# Patient Record
Sex: Female | Born: 1965
Health system: Southern US, Community
[De-identification: ages and names within clinical notes are randomized; demographics above are authoritative.]

## PROBLEM LIST (undated history)

## (undated) DIAGNOSIS — M1990 Unspecified osteoarthritis, unspecified site: Secondary | ICD-10-CM

## (undated) DIAGNOSIS — I10 Essential (primary) hypertension: Secondary | ICD-10-CM

## (undated) HISTORY — PX: COLONOSCOPY: SHX174

---

## 1996-03-11 HISTORY — PX: TUBAL LIGATION: SHX77

## 1999-03-12 HISTORY — PX: HERNIA REPAIR: SHX51

## 2000-03-28 ENCOUNTER — Ambulatory Visit (HOSPITAL_BASED_OUTPATIENT_CLINIC_OR_DEPARTMENT_OTHER): Admission: RE | Admit: 2000-03-28 | Discharge: 2000-03-28 | Payer: Self-pay | Admitting: *Deleted

## 2013-04-19 ENCOUNTER — Ambulatory Visit: Payer: Self-pay | Admitting: Physician Assistant

## 2014-12-14 ENCOUNTER — Emergency Department
Admission: EM | Admit: 2014-12-14 | Discharge: 2014-12-14 | Disposition: A | Discharge status: Home / Self Care | Payer: Self-pay | Attending: Emergency Medicine | Admitting: Emergency Medicine

## 2014-12-14 ENCOUNTER — Emergency Department: Payer: Self-pay

## 2014-12-14 ENCOUNTER — Encounter: Payer: Self-pay | Admitting: Emergency Medicine

## 2014-12-14 DIAGNOSIS — T485X5A Adverse effect of other anti-common-cold drugs, initial encounter: Secondary | ICD-10-CM

## 2014-12-14 DIAGNOSIS — I10 Essential (primary) hypertension: Secondary | ICD-10-CM

## 2014-12-14 DIAGNOSIS — R079 Chest pain, unspecified: Secondary | ICD-10-CM | POA: Insufficient documentation

## 2014-12-14 DIAGNOSIS — R0981 Nasal congestion: Secondary | ICD-10-CM | POA: Insufficient documentation

## 2014-12-14 DIAGNOSIS — J310 Chronic rhinitis: Secondary | ICD-10-CM

## 2014-12-14 DIAGNOSIS — Z87891 Personal history of nicotine dependence: Secondary | ICD-10-CM | POA: Insufficient documentation

## 2014-12-14 HISTORY — DX: Essential (primary) hypertension: I10

## 2014-12-14 LAB — TROPONIN I: Troponin I: <0.03 ng/mL (ref <0.031)

## 2014-12-14 LAB — BASIC METABOLIC PANEL
ANION GAP: 5 (ref 5–15)
BUN: 16 mg/dL (ref 6–20)
CALCIUM: 8.8 mg/dL — AB (ref 8.9–10.3)
CO2: 27 mmol/L (ref 22–32)
Chloride: 100 mmol/L — ABNORMAL LOW (ref 101–111)
Creatinine, Ser: 0.85 mg/dL (ref 0.44–1.00)
GFR calc Af Amer: >60 mL/min (ref >60)
GFR calc non Af Amer: >60 mL/min (ref >60)
GLUCOSE: 104 mg/dL — AB (ref 65–99)
Potassium: 3.4 mmol/L — ABNORMAL LOW (ref 3.5–5.1)
Sodium: 132 mmol/L — ABNORMAL LOW (ref 135–145)

## 2014-12-14 LAB — CBC WITH DIFFERENTIAL/PLATELET
Basophils Absolute: 0.1 10*3/uL (ref 0–0.1)
Basophils Relative: 1 %
EOS ABS: 0.3 10*3/uL (ref 0–0.7)
Eosinophils Relative: 4 %
HEMATOCRIT: 36 % (ref 35.0–47.0)
HEMOGLOBIN: 12.2 g/dL (ref 12.0–16.0)
LYMPHS ABS: 3.6 10*3/uL (ref 1.0–3.6)
LYMPHS PCT: 50 %
MCH: 32.4 pg (ref 26.0–34.0)
MCHC: 33.8 g/dL (ref 32.0–36.0)
MCV: 95.8 fL (ref 80.0–100.0)
MONOS PCT: 8 %
Monocytes Absolute: 0.6 10*3/uL (ref 0.2–0.9)
NEUTROS PCT: 37 %
Neutro Abs: 2.7 10*3/uL (ref 1.4–6.5)
Platelets: 264 10*3/uL (ref 150–440)
RBC: 3.75 MIL/uL — ABNORMAL LOW (ref 3.80–5.20)
RDW: 12.3 % (ref 11.5–14.5)
WBC: 7.2 10*3/uL (ref 3.6–11.0)

## 2014-12-14 MED ORDER — AMLODIPINE BESYLATE 5 MG PO TABS
5.0000 mg | ORAL_TABLET | Freq: Once | ORAL | Status: AC
  Administered 2014-12-14: 5 mg via ORAL

## 2014-12-14 MED ORDER — AMLODIPINE BESYLATE 5 MG PO TABS: 5.0000 mg | ORAL_TABLET | Freq: Two times a day (BID) | ORAL | Status: DC

## 2014-12-14 MED ORDER — AMLODIPINE BESYLATE 5 MG PO TABS: 5.0000 mg | ORAL_TABLET | Freq: Two times a day (BID) | ORAL | Status: AC

## 2014-12-14 MED ORDER — AMLODIPINE BESYLATE 5 MG PO TABS
ORAL_TABLET | ORAL | Status: AC
  Filled 2014-12-14: qty 1

## 2014-12-14 NOTE — Discharge Instructions (Signed)
You were evaluated for nasal congestion and I suspect that he may have seasonal allergies, but that the nasal congestion is worse due to the prolonged used of the decongestant spray. Stop the nasal spray been using.  You may try an over-the-counter nasal spray that has "nasal saline. " You should also try over-the-counter allergy medication Zyrtec, use as directed.  You're given a refill on your previous blood pressure medication Norvasc at year old dose of 5 mg twice daily. Follow-up with a primary care physician, you're referred to the [facility] clinic within one week for blood pressure recheck and follow-up.  Return to the emergency department for any new or worsening condition including any chest pain, shortness of breath, trouble breathing, lightheadedness, passing out, or any other symptoms concerning to you.    Hypertension Hypertension, commonly called high blood pressure, is when the force of blood pumping through your arteries is too strong. Your arteries are the blood vessels that carry blood from your heart throughout your body. A blood pressure reading consists of a higher number over a lower number, such as 110/72. The higher number (systolic) is the pressure inside your arteries when your heart pumps. The lower number (diastolic) is the pressure inside your arteries when your heart relaxes. Ideally you want your blood pressure below 120/80. Hypertension forces your heart to work harder to pump blood. Your arteries may become narrow or stiff. Having untreated or uncontrolled hypertension can cause heart attack, stroke, kidney disease, and other problems. RISK FACTORS Some risk factors for high blood pressure are controllable. Others are not.  Risk factors you cannot control include:   Race. You may be at higher risk if you are African American.  Age. Risk increases with age.  Gender. Men are at higher risk than women before age 40 years. After age 32, women are at higher risk than  men. Risk factors you can control include:  Not getting enough exercise or physical activity.  Being overweight.  Getting too much fat, sugar, calories, or salt in your diet.  Drinking too much alcohol. SIGNS AND SYMPTOMS Hypertension does not usually cause signs or symptoms. Extremely high blood pressure (hypertensive crisis) may cause headache, anxiety, shortness of breath, and nosebleed. DIAGNOSIS To check if you have hypertension, your health care provider will measure your blood pressure while you are seated, with your arm held at the level of your heart. It should be measured at least twice using the same arm. Certain conditions can cause a difference in blood pressure between your right and left arms. A blood pressure reading that is higher than normal on one occasion does not mean that you need treatment. If it is not clear whether you have high blood pressure, you may be asked to return on a different day to have your blood pressure checked again. Or, you may be asked to monitor your blood pressure at home for 1 or more weeks. TREATMENT Treating high blood pressure includes making lifestyle changes and possibly taking medicine. Living a healthy lifestyle can help lower high blood pressure. You may need to change some of your habits. Lifestyle changes may include:  Following the DASH diet. This diet is high in fruits, vegetables, and whole grains. It is low in salt, red meat, and added sugars.  Keep your sodium intake below 2,300 mg per day.  Getting at least 30-45 minutes of aerobic exercise at least 4 times per week.  Losing weight if necessary.  Not smoking.  Limiting alcoholic beverages.  Learning ways to reduce stress. Your health care provider may prescribe medicine if lifestyle changes are not enough to get your blood pressure under control, and if one of the following is true:  You are 40-9 years of age and your systolic blood pressure is above 140.  You are 57  years of age or older, and your systolic blood pressure is above 150.  Your diastolic blood pressure is above 90.  You have diabetes, and your systolic blood pressure is over 140 or your diastolic blood pressure is over 90.  You have kidney disease and your blood pressure is above 140/90.  You have heart disease and your blood pressure is above 140/90. Your personal target blood pressure may vary depending on your medical conditions, your age, and other factors. HOME CARE INSTRUCTIONS  Have your blood pressure rechecked as directed by your health care provider.   Take medicines only as directed by your health care provider. Follow the directions carefully. Blood pressure medicines must be taken as prescribed. The medicine does not work as well when you skip doses. Skipping doses also puts you at risk for problems.  Do not smoke.   Monitor your blood pressure at home as directed by your health care provider. SEEK MEDICAL CARE IF:   You think you are having a reaction to medicines taken.  You have recurrent headaches or feel dizzy.  You have swelling in your ankles.  You have trouble with your vision. SEEK IMMEDIATE MEDICAL CARE IF:  You develop a severe headache or confusion.  You have unusual weakness, numbness, or feel faint.  You have severe chest or abdominal pain.  You vomit repeatedly.  You have trouble breathing. MAKE SURE YOU:   Understand these instructions.  Will watch your condition.  Will get help right away if you are not doing well or get worse.   This information is not intended to replace advice given to you by your health care provider. Make sure you discuss any questions you have with your health care provider.   Document Released: 02/25/2005 Document Revised: 07/12/2014 Document Reviewed: 12/18/2012 Elsevier Interactive Patient Education [employer]

## 2014-12-14 NOTE — ED Provider Notes (Signed)
[facility] Emergency Department Provider Note   ____________________________________________  Time seen: 7:25 PM I have reviewed the triage vital signs and the triage nursing note.  HISTORY  Chief Complaint Nasal Congestion and Chest Pain   Historian Patient  HPI Heidi Freeman is a 49 y.o. female who presents for nasal congestion. Patient states it's been like this for about 1 month. She's been using oxymetazoline nasal spray 1 to several times per day. She also states that she's been having drainage postnasally and gags on sputum. No yellow sputum. No fever. No coughing or trouble breathing. She does occasionally have chest tightness or pressure at the tip of the sternum. She does not describe this as chest pain. She is denying GERD symptoms. She has taken herself off of her blood pressure medications recently after she did not refill her prescription. She is currently without a primary care physician. She is previously taking 5 mg Norvasc twice a day for several years reportedly.    Past Medical History  Diagnosis Date  . Hypertension     There are no active problems to display for this patient.   History reviewed. No pertinent past surgical history.  Current Outpatient Rx  Name  Route  Sig  Dispense  Refill  . amLODipine (NORVASC) 5 MG tablet   Oral   Take 1 tablet (5 mg total) by mouth 2 (two) times daily.   60 tablet   0     Allergies Review of patient's allergies indicates no known allergies.  History reviewed. No pertinent family history.  Social History Social History  Substance Use Topics  . Smoking status: Former Games developer  . Smokeless tobacco: None  . Alcohol Use: No    Review of Systems  Constitutional: Negative for fever. Eyes: Negative for visual changes. ENT: Negative for sore throat.positive for nasal congestion. Cardiovascular: Negative for chest pain.occasional sternal burning. Respiratory: Negative for shortness of  breath. Gastrointestinal: Negative for abdominal pain, vomiting and diarrhea. Genitourinary: Negative for dysuria. Musculoskeletal: Negative for back pain. Skin: Negative for rash. Neurological: Negative for headache. 10 point Review of Systems otherwise negative ____________________________________________   PHYSICAL EXAM:  VITAL SIGNS: ED Triage Vitals  Enc Vitals Group     BP 12/14/14 1852 224/109 mmHg     Pulse Rate 12/14/14 1852 89     Resp 12/14/14 1852 20     Temp 12/14/14 1852 98.2 F (36.8 C)     Temp Source 12/14/14 1852 Oral     SpO2 12/14/14 1852 100 %     Weight 12/14/14 1852 120 lb (54.432 kg)     Height 12/14/14 1852 5' (1.524 m)     Head Cir --      Peak Flow --      Pain Score 12/14/14 1903 0     Pain Loc --      Pain Edu? --      Excl. in GC? --      Constitutional: Alert and oriented. Well appearing and in no distress. Eyes: Conjunctivae are normal. PERRL. Normal extraocular movements. ENT   Head: Normocephalic and atraumatic.   Nose: No congestion/rhinnorhea.   Mouth/Throat: Mucous membranes are moist.   Neck: No stridor. Cardiovascular/Chest: Normal rate, regular rhythm.  No murmurs, rubs, or gallops. Respiratory: Normal respiratory effort without tachypnea nor retractions. Breath sounds are clear and equal bilaterally. No wheezes/rales/rhonchi. Gastrointestinal: Soft. No distention, no guarding, no rebound. Nontender   Genitourinary/rectal:Deferred Musculoskeletal: Nontender with normal range of motion in all  extremities. No joint effusions.  No lower extremity tenderness.  No edema. Neurologic:  Normal speech and language. No gross or focal neurologic deficits are appreciated. Skin:  Skin is warm, dry and intact. No rash noted. Psychiatric: Mood and affect are normal. Speech and behavior are normal. Patient exhibits appropriate insight and judgment.  ____________________________________________   EKG I, Governor Rooks, MD, the  attending physician have personally viewed and interpreted all ECGs.  90 bpm. Normal sinus rhythm. Narrow QRS. Normal axis. Nonspecific T wave. ____________________________________________  LABS (pertinent positives/negatives)  Sodium 132, potassium 3.4, chloride100, other wise basic metabolic panel without significant abnormality Troponin less than 0.03 White blood count 7.2, hemoglobin 12.2 and platelet count 264  ____________________________________________  RADIOLOGY All Xrays were viewed by me. Imaging interpreted by Radiologist.  Chest x-ray two-view: Negative chest x-ray __________________________________________  PROCEDURES  Procedure(s) performed: None  Critical Care performed: None  ____________________________________________   ED COURSE / ASSESSMENT AND PLAN  CONSULTATIONS: None  Pertinent labs & imaging results that were available during my care of the patient were reviewed by me and considered in my medical decision making (see chart for details).   Patient's nasal congestion I suspect may have started off as either a virus or seasonal allergies, however is now rebound nasal congestion due to prolonged decongestant use. I discussed this with the patient. She is to try nasal saline spray, and Zyrtec. With respect to her elevated blood pressure, I don't think she is having any symptoms related to it, and her EKG and labs are reassuring. She'll be restarted on her prior blood pressure medication which is Norvasc 5 mg twice daily.  Repeat blood pressure 160/103. Patient able to be discharged as she is symptomatic.  Patient / Family / Caregiver informed of clinical course, medical decision-making process, and agree with plan.   I discussed return precautions, follow-up instructions, and discharged instructions with patient and/or family.  ___________________________________________   FINAL CLINICAL IMPRESSION(S) / ED DIAGNOSES   Final diagnoses:  Nasal  congestion due to prolonged use of decongestants  Essential hypertension       Governor Rooks, MD 12/14/14 2148

## 2014-12-14 NOTE — ED Notes (Addendum)
Pt states she ahs a hx of HTN, and has not been compliant with her medications. Pt states has been taking OTC nasal spray for nasal congestion that drains down into her chest and causes tightness in the epigastric region. Pt states occassional SOB, but denies SOB today. Pt states these symptoms have been going on x 1 mo.

## 2016-10-11 ENCOUNTER — Other Ambulatory Visit: Payer: Self-pay | Admitting: Physician Assistant

## 2016-10-11 DIAGNOSIS — Z1239 Encounter for other screening for malignant neoplasm of breast: Secondary | ICD-10-CM

## 2016-11-28 ENCOUNTER — Ambulatory Visit
Admission: RE | Admit: 2016-11-28 | Discharge: 2016-11-28 | Disposition: A | Discharge status: Home / Self Care | Payer: BLUE CROSS/BLUE SHIELD | Source: Ambulatory Visit | Attending: Physician Assistant | Admitting: Physician Assistant

## 2016-11-28 DIAGNOSIS — Z1231 Encounter for screening mammogram for malignant neoplasm of breast: Secondary | ICD-10-CM | POA: Diagnosis not present

## 2016-11-28 DIAGNOSIS — Z1239 Encounter for other screening for malignant neoplasm of breast: Secondary | ICD-10-CM

## 2018-01-13 ENCOUNTER — Other Ambulatory Visit: Payer: Self-pay | Admitting: Physician Assistant

## 2018-01-19 ENCOUNTER — Other Ambulatory Visit: Payer: Self-pay | Admitting: Physician Assistant

## 2018-01-19 DIAGNOSIS — Z1231 Encounter for screening mammogram for malignant neoplasm of breast: Secondary | ICD-10-CM

## 2018-02-23 ENCOUNTER — Ambulatory Visit
Admission: RE | Admit: 2018-02-23 | Discharge: 2018-02-23 | Disposition: A | Discharge status: Home / Self Care | Payer: BLUE CROSS/BLUE SHIELD | Source: Ambulatory Visit | Attending: Physician Assistant | Admitting: Physician Assistant

## 2018-02-23 DIAGNOSIS — Z1231 Encounter for screening mammogram for malignant neoplasm of breast: Secondary | ICD-10-CM

## 2018-12-15 ENCOUNTER — Other Ambulatory Visit: Payer: Self-pay

## 2018-12-15 DIAGNOSIS — Z20822 Contact with and (suspected) exposure to covid-19: Secondary | ICD-10-CM

## 2018-12-17 ENCOUNTER — Telehealth: Payer: Self-pay | Admitting: General Practice

## 2018-12-17 LAB — NOVEL CORONAVIRUS, NAA: SARS-CoV-2, NAA: NOT DETECTED

## 2018-12-17 NOTE — Telephone Encounter (Signed)
Negative COVID results given. Patient results "NOT Detected." Caller expressed understanding. ° °

## 2019-05-26 ENCOUNTER — Other Ambulatory Visit: Payer: Self-pay | Admitting: Physician Assistant

## 2019-05-26 DIAGNOSIS — Z1231 Encounter for screening mammogram for malignant neoplasm of breast: Secondary | ICD-10-CM

## 2019-06-01 ENCOUNTER — Ambulatory Visit: Payer: Self-pay

## 2019-06-02 ENCOUNTER — Other Ambulatory Visit: Payer: Self-pay

## 2019-06-02 ENCOUNTER — Ambulatory Visit
Admission: RE | Admit: 2019-06-02 | Discharge: 2019-06-02 | Disposition: A | Discharge status: Home / Self Care | Payer: 59 | Source: Ambulatory Visit | Attending: Physician Assistant | Admitting: Physician Assistant

## 2019-06-02 ENCOUNTER — Ambulatory Visit
Admission: EM | Admit: 2019-06-02 | Discharge: 2019-06-02 | Discharge status: Against Medical Advice | Payer: 59 | Source: Ambulatory Visit | Attending: Physician Assistant | Admitting: Physician Assistant

## 2019-06-02 DIAGNOSIS — Z1231 Encounter for screening mammogram for malignant neoplasm of breast: Secondary | ICD-10-CM

## 2020-08-21 ENCOUNTER — Other Ambulatory Visit: Payer: Self-pay | Admitting: Physician Assistant

## 2020-08-30 ENCOUNTER — Other Ambulatory Visit: Payer: Self-pay | Admitting: Physician Assistant

## 2020-08-30 DIAGNOSIS — Z1231 Encounter for screening mammogram for malignant neoplasm of breast: Secondary | ICD-10-CM

## 2020-09-07 ENCOUNTER — Other Ambulatory Visit: Payer: Self-pay | Admitting: Physician Assistant

## 2020-09-12 ENCOUNTER — Ambulatory Visit
Admission: RE | Admit: 2020-09-12 | Discharge: 2020-09-12 | Disposition: A | Discharge status: Home / Self Care | Payer: 59 | Source: Ambulatory Visit | Attending: Physician Assistant | Admitting: Physician Assistant

## 2020-09-12 ENCOUNTER — Other Ambulatory Visit: Payer: Self-pay

## 2020-09-12 DIAGNOSIS — Z1231 Encounter for screening mammogram for malignant neoplasm of breast: Secondary | ICD-10-CM | POA: Diagnosis not present

## 2020-10-03 ENCOUNTER — Encounter: Payer: Self-pay | Admitting: Unknown Physician Specialty

## 2020-11-15 ENCOUNTER — Encounter: Payer: Self-pay | Admitting: Unknown Physician Specialty

## 2020-11-21 NOTE — Discharge Instructions (Signed)
[facility] ENDOSCOPIC SINUS SURGERY [facility]  What is Functional Endoscopic Sinus Surgery?  The Surgery involves making the natural openings of the sinuses larger by removing the bony partitions that separate the sinuses from the nasal cavity.  The natural sinus lining is preserved as much as possible to allow the sinuses to resume normal function after the surgery.  In some patients nasal polyps (excessively swollen lining of the sinuses) may be removed to relieve obstruction of the sinus openings.  The surgery is performed through the nose using lighted scopes, which eliminates the need for incisions on the face.  A septoplasty is a different procedure which is sometimes performed with sinus surgery.  It involves straightening the boy partition that separates the two sides of your nose.  A crooked or deviated septum may need repair if is obstructing the sinuses or nasal airflow.  Turbinate reduction is also often performed during sinus surgery.  The turbinates are bony proturberances from the side walls of the nose which swell and can obstruct the nose in patients with sinus and allergy problems.  Their size can be surgically reduced to help relieve nasal obstruction.  What Can Sinus Surgery Do For Me?  Sinus surgery can reduce the frequency of sinus infections requiring antibiotic treatment.  This can provide improvement in nasal congestion, post-nasal drainage, facial pressure and nasal obstruction.  Surgery will NOT prevent you from ever having an infection again, so it usually only for patients who get infections 4 or more times yearly requiring antibiotics, or for infections that do not clear with antibiotics.  It will not cure nasal allergies, so patients with allergies may still require medication to treat their allergies after surgery. Surgery may improve headaches related to sinusitis, however, some people will continue to  require medication to control sinus headaches related to allergies.  Surgery will do nothing for other forms of headache (migraine, tension or cluster).  What Are the Risks of Endoscopic Sinus Surgery?  Current techniques allow surgery to be performed safely with little risk, however, there are rare complications that patients should be aware of.  Because the sinuses are located around the eyes, there is risk of eye injury, including blindness, though again, this would be quite rare. This is usually a result of bleeding behind the eye during surgery, which can effect vision, though there are treatments to protect the vision and prevent permanent injury. More serious complications would include bleeding inside the brain cavity or damage to the brain.This happens when the fluid around the brain leaks out into the sinus cavity.  Again, all of these complications are uncommon, and spinal fluid leaks can be safely managed surgically if they occur.  The most common complication of sinus surgery is bleeding from the nose, which may require packing or cauterization of the nose.  Patients with polyps may experience recurrence of the polyps that would require revision surgery.  Alterations of sense of smell or injury to the tear ducts are also rare complications.   What is the Surgery Like, and what is the Recovery?  The Surgery usually takes a couple of hours to perform, and is usually performed under a general anesthetic (completely asleep).  Patients are usually discharged home after a couple of hours.  Sometimes during surgery it is necessary to pack the nose to control bleeding, and the packing is left in place for 24 - 48 hours, and removed by your surgeon.  If   a septoplasty was performed during the procedure, there is often a splint placed which must be removed after 5-7 days.   Discomfort: Pain is usually mild to moderate, and can be controlled by prescription pain medication or acetaminophen (Tylenol).   Aspirin, Ibuprofen (Advil, Motrin), or Naprosyn (Aleve) should be avoided, as they can cause increased bleeding.  Most patients feel sinus pressure like they have a bad head cold for several days.  Sleeping with your head elevated can help reduce swelling and facial pressure, as can ice packs over the face.  A humidifier may be helpful to keep the mucous and blood from drying in the nose.   Diet: There are no specific diet restrictions, however, you should generally start with clear liquids and a light diet of bland foods because the anesthetic can cause some nausea.  Advance your diet depending on how your stomach feels.  Taking your pain medication with food will often help reduce stomach upset which pain medications can cause.  Nasal Saline Irrigation: It is important to remove blood clots and dried mucous from the nose as it is healing.  This is done by having you irrigate the nose at least 3 - 4 times daily with a salt water solution.  We recommend using NeilMed Sinus Rinse (available at the drug store).  Fill the squeeze bottle with the solution, bend over a sink, and insert the tip of the squeeze bottle into the nose  of an inch.  Point the tip of the squeeze bottle towards the inside corner of the eye on the same side your irrigating.  Squeeze the bottle and gently irrigate the nose.  If you bend forward as you do this, most of the fluid will flow back out of the nose, instead of down your throat.   The solution should be warm, near body temperature, when you irrigate.   Each time you irrigate, you should use a full squeeze bottle.   Note that if you are instructed to use Nasal Steroid Sprays at any time after your surgery, irrigate with saline BEFORE using the steroid spray, so you do not wash it all out of the nose. Another product, Nasal Saline Gel (such as AYR Nasal Saline Gel) can be applied in each nostril 3 - 4 times daily to moisture the nose and reduce scabbing or crusting.  Bleeding:   Bloody drainage from the nose can be expected for several days, and patients are instructed to irrigate their nose frequently with salt water to help remove mucous and blood clots.  The drainage may be dark red or brown, though some fresh blood may be seen intermittently, especially after irrigation.  Do not blow you nose, as bleeding may occur. If you must sneeze, keep your mouth open to allow air to escape through your mouth.  If heavy bleeding occurs: Irrigate the nose with saline to rinse out clots, then spray the nose 3 - 4 times with Afrin Nasal Decongestant Spray.  The spray will constrict the blood vessels to slow bleeding.  Pinch the lower half of your nose shut to apply pressure, and lay down with your head elevated.  Ice packs over the nose may help as well. If bleeding persists despite these measures, you should notify your doctor.  Do not use the Afrin routinely to control nasal congestion after surgery, as it can result in worsening congestion and may affect healing.     Activity: Return to work varies among patients. Most patients will be out   of work at least 5 - 7 days to recover.  Patient may return to work after they are off of narcotic pain medication, and feeling well enough to perform the functions of their job.  Patients must avoid heavy lifting (over 10 pounds) or strenuous physical for 2 weeks after surgery, so your employer may need to assign you to light duty, or keep you out of work longer if light duty is not possible.  NOTE: you should not drive, operate dangerous machinery, do any mentally demanding tasks or make any important legal or financial decisions while on narcotic pain medication and recovering from the general anesthetic.    Call Your Doctor Immediately if You Have Any of the Following: Bleeding that you cannot control with the above measures Loss of vision, double vision, bulging of the eye or black eyes. Fever over 101 degrees Neck stiffness with severe headache,  fever, nausea and change in mental state. You are always encouraged to call anytime with concerns, however, please call with requests for pain medication refills during office hours.  Office Endoscopy: During follow-up visits your doctor will remove any packing or splints that may have been placed and evaluate and clean your sinuses endoscopically.  Topical anesthetic will be used to make this as comfortable as possible, though you may want to take your pain medication prior to the visit.  How often this will need to be done varies from patient to patient.  After complete recovery from the surgery, you may need follow-up endoscopy from time to time, particularly if there is concern of recurrent infection or nasal polyps.  

## 2020-11-24 ENCOUNTER — Ambulatory Visit: Payer: 59 | Admitting: Anesthesiology

## 2020-11-24 ENCOUNTER — Other Ambulatory Visit: Payer: Self-pay

## 2020-11-24 ENCOUNTER — Ambulatory Visit
Admission: RE | Admit: 2020-11-24 | Discharge: 2020-11-24 | Disposition: A | Discharge status: Home / Self Care | Payer: 59 | Attending: Unknown Physician Specialty | Admitting: Unknown Physician Specialty

## 2020-11-24 ENCOUNTER — Encounter: Payer: Self-pay | Admitting: Unknown Physician Specialty

## 2020-11-24 ENCOUNTER — Encounter
Admission: RE | Disposition: A | Discharge status: Home / Self Care | Payer: Self-pay | Source: Home / Self Care | Attending: Unknown Physician Specialty

## 2020-11-24 DIAGNOSIS — J342 Deviated nasal septum: Secondary | ICD-10-CM | POA: Diagnosis not present

## 2020-11-24 DIAGNOSIS — J3489 Other specified disorders of nose and nasal sinuses: Secondary | ICD-10-CM | POA: Insufficient documentation

## 2020-11-24 DIAGNOSIS — Z87891 Personal history of nicotine dependence: Secondary | ICD-10-CM | POA: Diagnosis not present

## 2020-11-24 DIAGNOSIS — Z79899 Other long term (current) drug therapy: Secondary | ICD-10-CM | POA: Insufficient documentation

## 2020-11-24 HISTORY — DX: Unspecified osteoarthritis, unspecified site: M19.90

## 2020-11-24 HISTORY — PX: NASAL TURBINATE REDUCTION: SHX2072

## 2020-11-24 HISTORY — PX: SEPTOPLASTY: SHX2393

## 2020-11-24 SURGERY — SEPTOPLASTY, NOSE
Anesthesia: General | Site: Nose

## 2020-11-24 MED ORDER — GLYCOPYRROLATE 0.2 MG/ML IJ SOLN
INTRAMUSCULAR | Status: DC | PRN
  Administered 2020-11-24: .1 mg via INTRAVENOUS

## 2020-11-24 MED ORDER — LIDOCAINE HCL (CARDIAC) PF 100 MG/5ML IV SOSY
PREFILLED_SYRINGE | INTRAVENOUS | Status: DC | PRN
  Administered 2020-11-24: 50 mg via INTRAVENOUS

## 2020-11-24 MED ORDER — MIDAZOLAM HCL 5 MG/5ML IJ SOLN
INTRAMUSCULAR | Status: DC | PRN
  Administered 2020-11-24: 2 mg via INTRAVENOUS

## 2020-11-24 MED ORDER — SCOPOLAMINE 1 MG/3DAYS TD PT72
1.0000 | MEDICATED_PATCH | Freq: Once | TRANSDERMAL | Status: DC
  Administered 2020-11-24: 1.5 mg via TRANSDERMAL

## 2020-11-24 MED ORDER — LACTATED RINGERS IV SOLN: INTRAVENOUS | Status: DC

## 2020-11-24 MED ORDER — ONDANSETRON HCL 4 MG/2ML IJ SOLN
4.0000 mg | Freq: Once | INTRAMUSCULAR | Status: AC | PRN
  Administered 2020-11-24: 4 mg via INTRAVENOUS

## 2020-11-24 MED ORDER — PHENYLEPHRINE HCL 0.5 % NA SOLN
NASAL | Status: DC | PRN
  Administered 2020-11-24: 15 mL via TOPICAL

## 2020-11-24 MED ORDER — FENTANYL CITRATE PF 50 MCG/ML IJ SOSY
25.0000 ug | PREFILLED_SYRINGE | INTRAMUSCULAR | Status: DC | PRN
  Administered 2020-11-24: 25 ug via INTRAVENOUS

## 2020-11-24 MED ORDER — FENTANYL CITRATE (PF) 100 MCG/2ML IJ SOLN
INTRAMUSCULAR | Status: DC | PRN
  Administered 2020-11-24 (×2): 50 ug via INTRAVENOUS

## 2020-11-24 MED ORDER — OXYCODONE HCL 5 MG/5ML PO SOLN
5.0000 mg | Freq: Once | ORAL | Status: AC | PRN
  Administered 2020-11-24: 5 mg via ORAL

## 2020-11-24 MED ORDER — OXYCODONE HCL 5 MG PO TABS
5.0000 mg | ORAL_TABLET | Freq: Once | ORAL | Status: AC | PRN
Start: 2020-11-24 — End: 2020-11-24

## 2020-11-24 MED ORDER — PROPOFOL 10 MG/ML IV BOLUS
INTRAVENOUS | Status: DC | PRN
  Administered 2020-11-24: 30 mg via INTRAVENOUS
  Administered 2020-11-24: 140 mg via INTRAVENOUS

## 2020-11-24 MED ORDER — DEXAMETHASONE SODIUM PHOSPHATE 4 MG/ML IJ SOLN
INTRAMUSCULAR | Status: DC | PRN
  Administered 2020-11-24: 10 mg via INTRAVENOUS

## 2020-11-24 MED ORDER — OXYMETAZOLINE HCL 0.05 % NA SOLN
6.0000 | Freq: Once | NASAL | Status: AC
  Administered 2020-11-24: 6 via NASAL

## 2020-11-24 MED ORDER — HYDROCODONE-ACETAMINOPHEN 5-300 MG PO TABS: 1.0000 | ORAL_TABLET | ORAL | 0 refills | Status: DC | PRN

## 2020-11-24 MED ORDER — LIDOCAINE-EPINEPHRINE 1 %-1:100000 IJ SOLN
INTRAMUSCULAR | Status: DC | PRN
  Administered 2020-11-24: 11 mL

## 2020-11-24 MED ORDER — SULFAMETHOXAZOLE-TRIMETHOPRIM 800-160 MG PO TABS
1.0000 | ORAL_TABLET | Freq: Two times a day (BID) | ORAL | 0 refills | Status: DC
Start: 2020-11-24 — End: 2023-10-20

## 2020-11-24 MED ORDER — ACETAMINOPHEN 10 MG/ML IV SOLN
1000.0000 mg | Freq: Once | INTRAVENOUS | Status: AC
  Administered 2020-11-24: 1000 mg via INTRAVENOUS

## 2020-11-24 MED ORDER — SUCCINYLCHOLINE CHLORIDE 200 MG/10ML IV SOSY
PREFILLED_SYRINGE | INTRAVENOUS | Status: DC | PRN
  Administered 2020-11-24: 80 mg via INTRAVENOUS

## 2020-11-24 MED ORDER — HYDRALAZINE HCL 20 MG/ML IJ SOLN
5.0000 mg | INTRAMUSCULAR | Status: AC | PRN
  Administered 2020-11-24 (×2): 5 mg via INTRAVENOUS

## 2020-11-24 SURGICAL SUPPLY — 19 items
COAG SUCT 10F 3.5MM HAND CTRL (MISCELLANEOUS) ×3 IMPLANT
DRAPE HEAD BAR (DRAPES) ×3 IMPLANT
DRESSING NASL FOAM PST OP SINU (MISCELLANEOUS) IMPLANT
DRSG NASAL FOAM POST OP SINU (MISCELLANEOUS) ×6
ELECT REM PT RETURN 9FT ADLT (ELECTROSURGICAL) ×3
ELECTRODE REM PT RTRN 9FT ADLT (ELECTROSURGICAL) ×2 IMPLANT
GAUZE 4X4 16PLY ~~LOC~~+RFID DBL (SPONGE) ×1 IMPLANT
GLOVE SURG ENC MOIS LTX SZ7.5 (GLOVE) ×6 IMPLANT
HANDLE YANKAUER SUCT BULB TIP (MISCELLANEOUS) ×3 IMPLANT
KIT TURNOVER KIT A (KITS) ×3 IMPLANT
PACK ENT CUSTOM (PACKS) ×3 IMPLANT
SPLINT NASAL SEPTAL BLV .50 ST (MISCELLANEOUS) ×1 IMPLANT
SPONGE NEURO XRAY DETECT 1X3 (DISPOSABLE) ×3 IMPLANT
SUT CHROMIC 3-0 (SUTURE) ×3
SUT CHROMIC 3-0 KS 27XMFL CR (SUTURE) ×2
SUT ETHILON 3-0 KS 30 BLK (SUTURE) ×3 IMPLANT
SUTURE CHRMC 3-0 KS 27XMFL CR (SUTURE) ×2 IMPLANT
TOWEL OR 17X26 4PK STRL BLUE (TOWEL DISPOSABLE) ×3 IMPLANT
WATER STERILE IRR 250ML POUR (IV SOLUTION) ×3 IMPLANT

## 2020-11-24 NOTE — Anesthesia Preprocedure Evaluation (Addendum)
Anesthesia Evaluation  Patient identified by MRN, date of birth, ID band Patient awake    Reviewed: Allergy & Precautions, NPO status , Patient's Chart, lab work & pertinent test results  Airway Mallampati: II  TM Distance: >3 FB Neck ROM: Full    Dental no notable dental hx.    Pulmonary former smoker,    Pulmonary exam normal        Cardiovascular hypertension, Pt. on medications and Pt. on home beta blockers Normal cardiovascular exam  Per cardiology note, "She is on aggressive antihypertensive regimen and including amlodipine 5 mg daily, losartan-hydrochlorothiazide 100-12.5 mg daily and metoprolol succinate 50 mg daily. She has occasional palpitations. Her baseline electrocardiogram shows normal sinus rhythm at a rate of 94 with a PR interval of 152 ms, QRS duration of 88 ms with a QTC of 492 ms. There is no ischemia. She underwent a functional study going 12 minutes in a Bruce protocol with no ischemia. She appears to be at low risk for surgery from a cardiac standpoint." Denton Ar, MD   Neuro/Psych negative neurological ROS  negative psych ROS   GI/Hepatic negative GI ROS, Neg liver ROS,   Endo/Other  negative endocrine ROS  Renal/GU negative Renal ROS     Musculoskeletal  (+) Arthritis ,   Abdominal Normal abdominal exam  (+)   Peds  Hematology negative hematology ROS (+)   Anesthesia Other Findings Deviated Nasal Septum  Hypertrophy of nasal turbinates  Reproductive/Obstetrics                           Anesthesia Physical Anesthesia Plan  ASA: 3  Anesthesia Plan: General   Post-op Pain Management:    Induction: Intravenous  PONV Risk Score and Plan: 3 and Midazolam, Ondansetron, Dexamethasone and Treatment may vary due to age or medical condition  Airway Management Planned: Oral ETT  Additional Equipment: None  Intra-op Plan:   Post-operative Plan: Extubation in  OR  Informed Consent: I have reviewed the patients History and Physical, chart, labs and discussed the procedure including the risks, benefits and alternatives for the proposed anesthesia with the patient or authorized representative who has indicated his/her understanding and acceptance.     Dental advisory given  Plan Discussed with: CRNA  Anesthesia Plan Comments:         Anesthesia Quick Evaluation

## 2020-11-24 NOTE — H&P (Signed)
The patient's history has been reviewed, patient examined, no change in status, stable for surgery.  Questions were answered to the patients satisfaction.  

## 2020-11-24 NOTE — Transfer of Care (Signed)
Immediate Anesthesia Transfer of Care Note  Patient: Heidi Freeman  Procedure(s) Performed: SEPTOPLASTY (Nose) TURBINATE REDUCTION/SUBMUCOSAL RESECTION (Bilateral: Nose)  Patient Location: PACU  Anesthesia Type: General  Level of Consciousness: awake, alert  and patient cooperative  Airway and Oxygen Therapy: Patient Spontanous Breathing and Patient connected to supplemental oxygen  Post-op Assessment: Post-op Vital signs reviewed, Patient's Cardiovascular Status Stable, Respiratory Function Stable, Patent Airway and No signs of Nausea or vomiting  Post-op Vital Signs: Reviewed and stable  Complications: No notable events documented.

## 2020-11-24 NOTE — Op Note (Signed)
PREOPERATIVE DIAGNOSIS:  Chronic nasal obstruction.  POSTOPERATIVE DIAGNOSIS:  Chronic nasal obstruction.  SURGEON:  Davina Poke, M.D.  NAME OF PROCEDURE:  Nasal septoplasty. Submucous resection of inferior turbinates.  OPERATIVE FINDINGS:  Severe nasal septal deformity, hypertrophy of the inferior turbinates.   DESCRIPTION OF THE PROCEDURE:  Heidi Freeman was identified in the holding area and taken to the operating room and placed in the supine position.  After general endotracheal anesthesia was induced, the table was turned 45 degrees and the patient was placed in a semi-Fowler position.  The nose was then topically anesthetized with Lidocaine, cotton pledgets were placed within each nostril. After approximately 5 minutes, this was removed at which time a local anesthetic of 1% Lidocaine 1:100,000 units of Epinephrine was used to inject the inferior turbinates in the nasal septum. A total of 12 ml was used. Examination of the nose showed a left nasal septal deformity and hypertrophied inferior turbinate.  Beginning on the right hand side a hemitransfixion incision was then created on the leading edge of the septum on the right.  A subperichondrial plane was elevated posteriorly on the left and taken back to the perpendicular plate of the ethmoid where subperiosteal plane was elevated posteriorly on the left. A  septal deviation  was identified on the left hand side.  An inferior rim of cartilage was removed anteriorly with care taken to leave an anterior strut to prevent nasal collapse.  The curvature in the septal cartilage was gently excised allowing the septal deviation to be removed.  This allowed the cartilage to swing back normal anatomic position. The septum was then replaced in the midline. Reinspection through each nostril showed excellent reduction of the septal deformity. A left inferior fenestration was then created to allow hematoma drainage.  With the septoplasty completed,  beginning on the left-hand side, a 15 blade was used to incise along the inferior edge of the inferior turbinate. A superior laterally based flap was then elevated. The underlying conchal bone of mucosa was excised using Knight scissors. The flap was then laid back over the turbinate stump and cauterized using suction cautery. In a similar fashion the submucous resection was performed on the right.  With the submucous resection completed bilaterally and no active bleeding, the hemitransfixion incision was then closed using two interrupted 3-0 chromic sutures.  Plastic nasal septal splints were placed within each nostril and affixed to the septum using a 3-0 nylon suture. Stammberger was then used beneath each inferior turbinate for hemostasis.    The patient tolerated the procedure well, was returned to anesthesia, extubated in the operating room, and taken to the recovery room in stable condition.    CULTURES:  None.  SPECIMENS:  None.  ESTIMATED BLOOD LOSS:  25 cc.  Davina Poke  11/24/2020  9:05 AM

## 2020-11-24 NOTE — Anesthesia Postprocedure Evaluation (Signed)
Anesthesia Post Note  Patient: Heidi Freeman  Procedure(s) Performed: SEPTOPLASTY (Nose) TURBINATE REDUCTION/SUBMUCOSAL RESECTION (Bilateral: Nose)     Patient location during evaluation: PACU Anesthesia Type: General Level of consciousness: awake and alert Pain management: pain level controlled Vital Signs Assessment: post-procedure vital signs reviewed and stable Respiratory status: nonlabored ventilation and spontaneous breathing Postop Assessment: no apparent nausea or vomiting Anesthetic complications: no   No notable events documented.  [name] [employer]

## 2020-11-24 NOTE — Anesthesia Procedure Notes (Signed)
Procedure Name: Intubation Date/Time: 11/24/2020 8:27 AM Performed by: Jimmy Picket, CRNA Pre-anesthesia Checklist: Patient identified, Emergency Drugs available, Suction available, Patient being monitored and Timeout performed Patient Re-evaluated:Patient Re-evaluated prior to induction Oxygen Delivery Method: Circle system utilized Preoxygenation: Pre-oxygenation with 100% oxygen Induction Type: IV induction Ventilation: Mask ventilation without difficulty Laryngoscope Size: Miller and 2 Grade View: Grade I Tube type: Oral Rae Tube size: 7.0 mm Number of attempts: 1 Placement Confirmation: ETT inserted through vocal cords under direct vision, positive ETCO2 and breath sounds checked- equal and bilateral Tube secured with: Tape Dental Injury: Teeth and Oropharynx as per pre-operative assessment

## 2020-11-27 ENCOUNTER — Encounter: Payer: Self-pay | Admitting: Unknown Physician Specialty

## 2021-05-17 ENCOUNTER — Other Ambulatory Visit: Payer: Self-pay | Admitting: Physician Assistant

## 2021-05-17 DIAGNOSIS — M5442 Lumbago with sciatica, left side: Secondary | ICD-10-CM

## 2021-05-30 ENCOUNTER — Ambulatory Visit: Payer: 59

## 2021-10-17 ENCOUNTER — Other Ambulatory Visit: Payer: Self-pay | Admitting: Physician Assistant

## 2021-10-17 DIAGNOSIS — Z1231 Encounter for screening mammogram for malignant neoplasm of breast: Secondary | ICD-10-CM

## 2021-10-19 ENCOUNTER — Other Ambulatory Visit: Payer: Self-pay | Admitting: Internal Medicine

## 2021-10-19 DIAGNOSIS — R1013 Epigastric pain: Secondary | ICD-10-CM

## 2021-11-01 ENCOUNTER — Ambulatory Visit
Admission: RE | Admit: 2021-11-01 | Discharge: 2021-11-01 | Disposition: A | Discharge status: Home / Self Care | Payer: 59 | Source: Ambulatory Visit | Attending: Physician Assistant | Admitting: Physician Assistant

## 2021-11-01 DIAGNOSIS — Z1231 Encounter for screening mammogram for malignant neoplasm of breast: Secondary | ICD-10-CM | POA: Diagnosis present

## 2021-11-20 ENCOUNTER — Ambulatory Visit
Admission: RE | Admit: 2021-11-20 | Discharge: 2021-11-20 | Disposition: A | Discharge status: Home / Self Care | Payer: 59 | Source: Ambulatory Visit | Attending: Internal Medicine | Admitting: Internal Medicine

## 2021-11-20 DIAGNOSIS — R1013 Epigastric pain: Secondary | ICD-10-CM

## 2022-02-06 IMAGING — MG MM DIGITAL SCREENING BILAT W/ TOMO AND CAD
8 series · 9 of 24 positions shown · non-contrast
Comparison: Previous exam(s).

CLINICAL DATA: Screening.

EXAM:
DIGITAL SCREENING BILATERAL MAMMOGRAM WITH TOMOSYNTHESIS AND CAD
TECHNIQUE: Bilateral screening digital craniocaudal and mediolateral oblique
mammograms were obtained. Bilateral screening digital breast
tomosynthesis was performed. The images were evaluated with
computer-aided detection.

[R CC synth-2D]
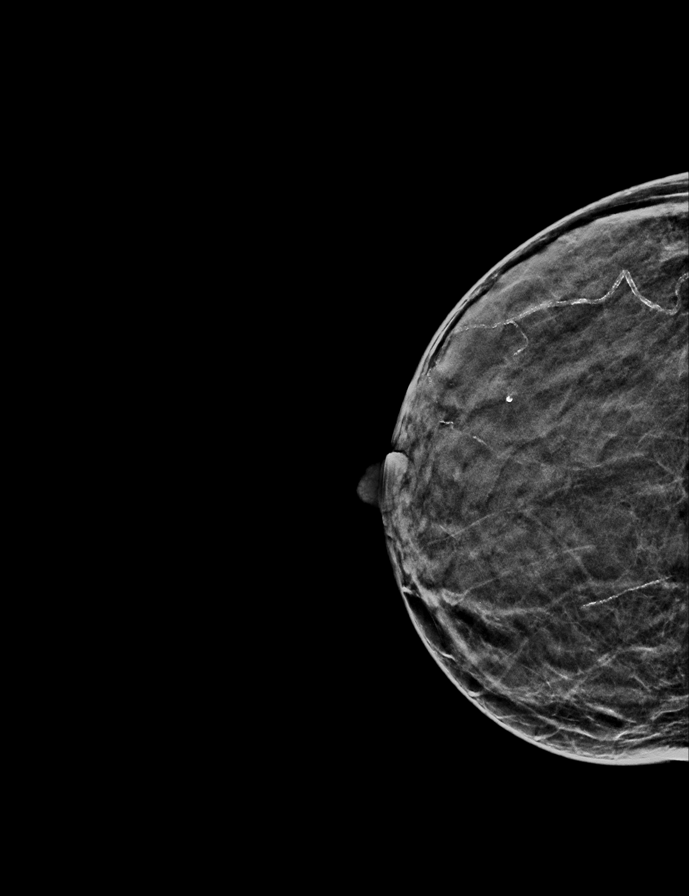

[L MLO synth-2D]
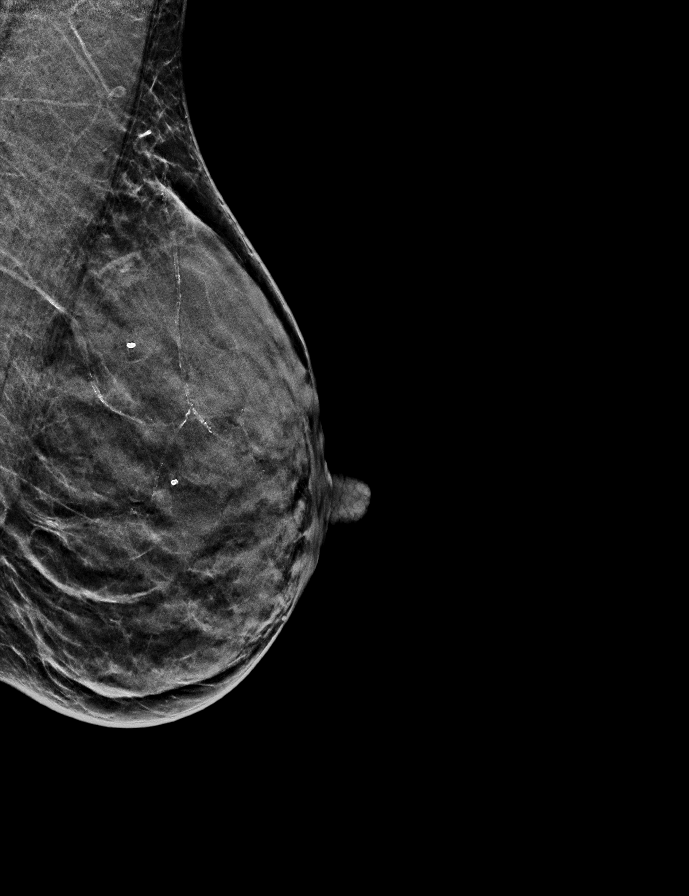

[L CC synth-2D]
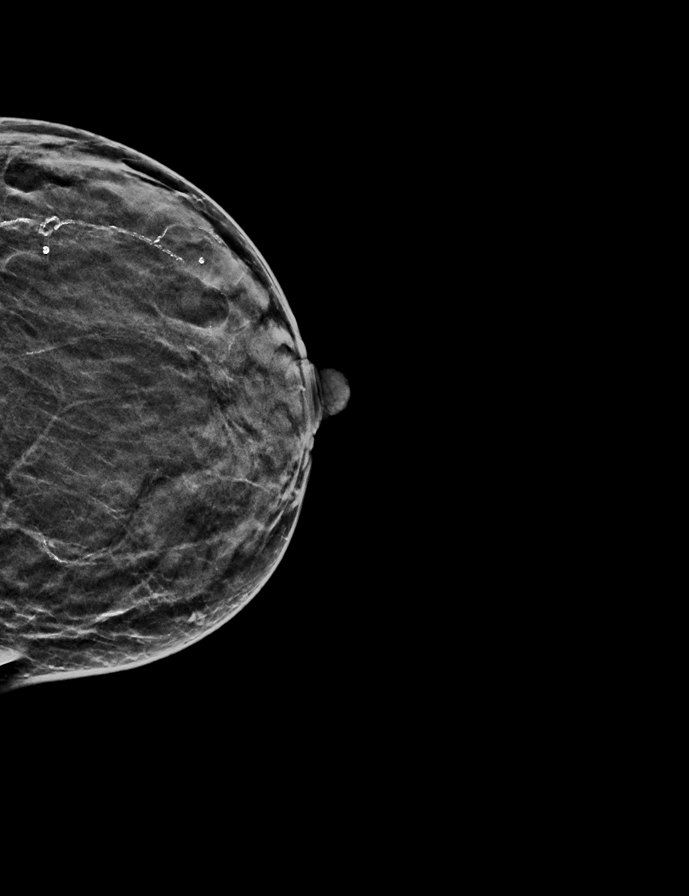

[R MLO synth-2D]
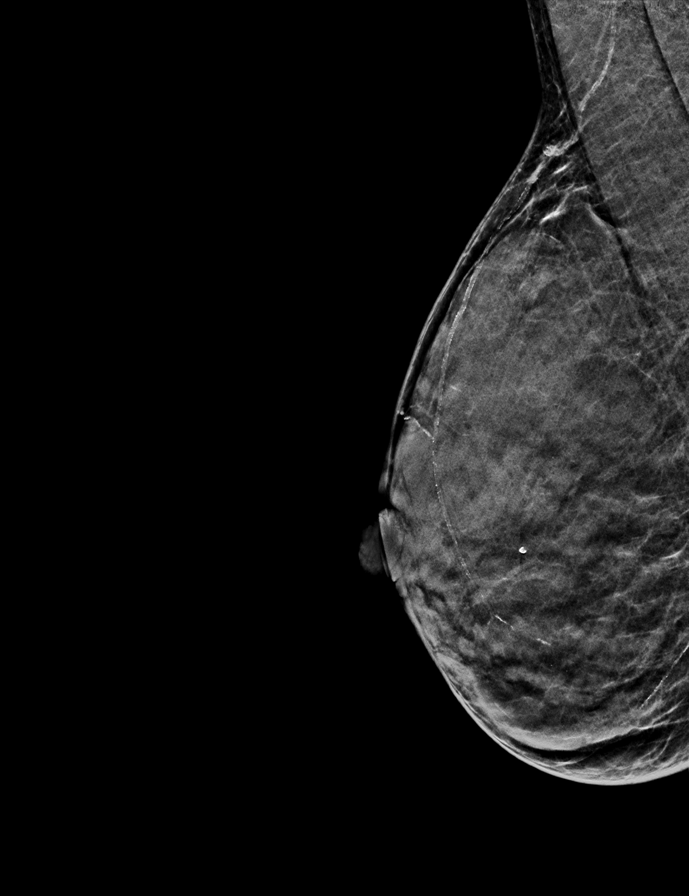

[R CC tomo · 2 of 34 frames shown]
[frame 12/34]
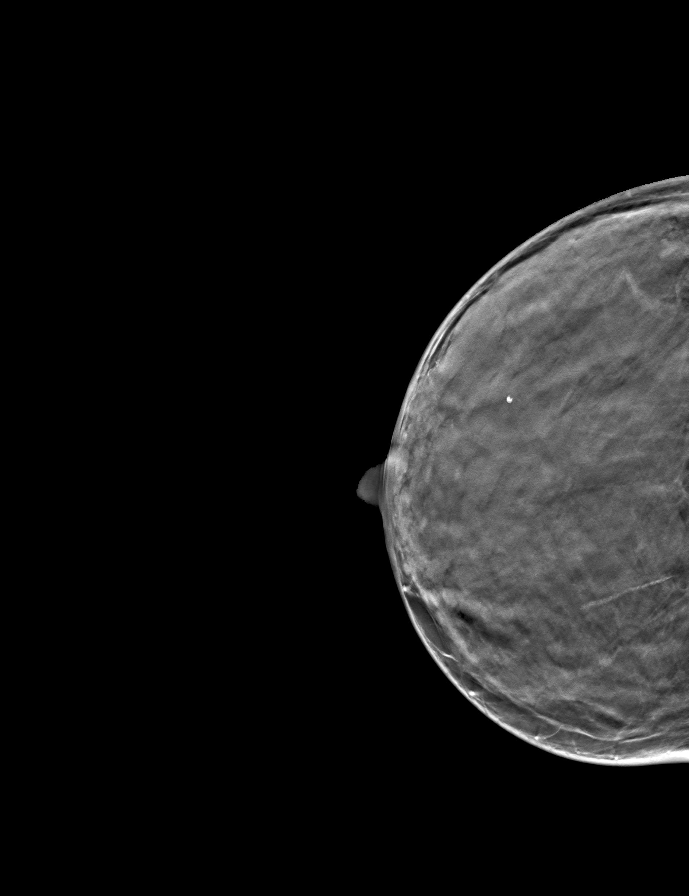
[frame 17/34]
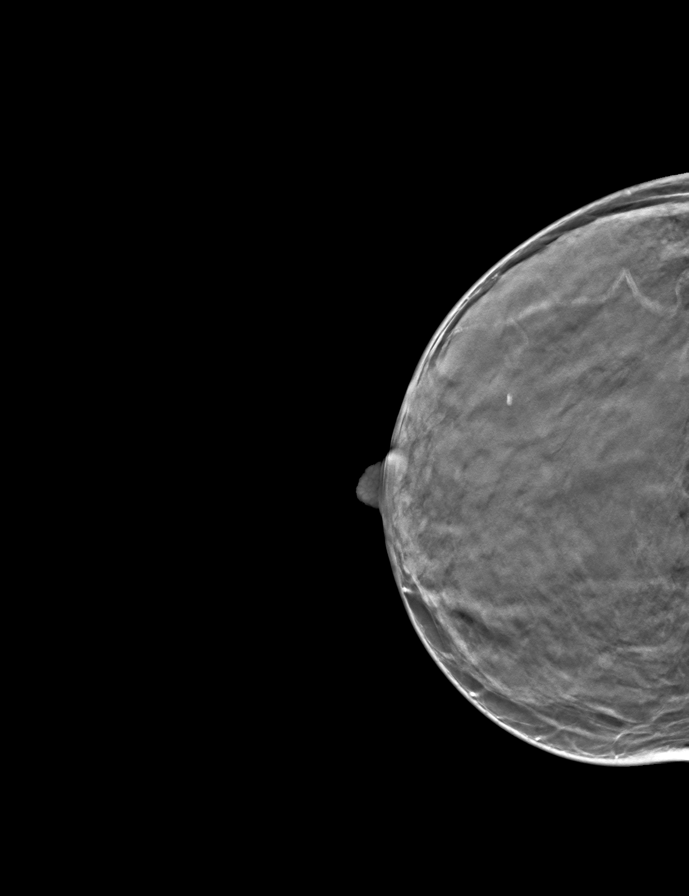

[R MLO tomo · tomo slice 17/32.0]
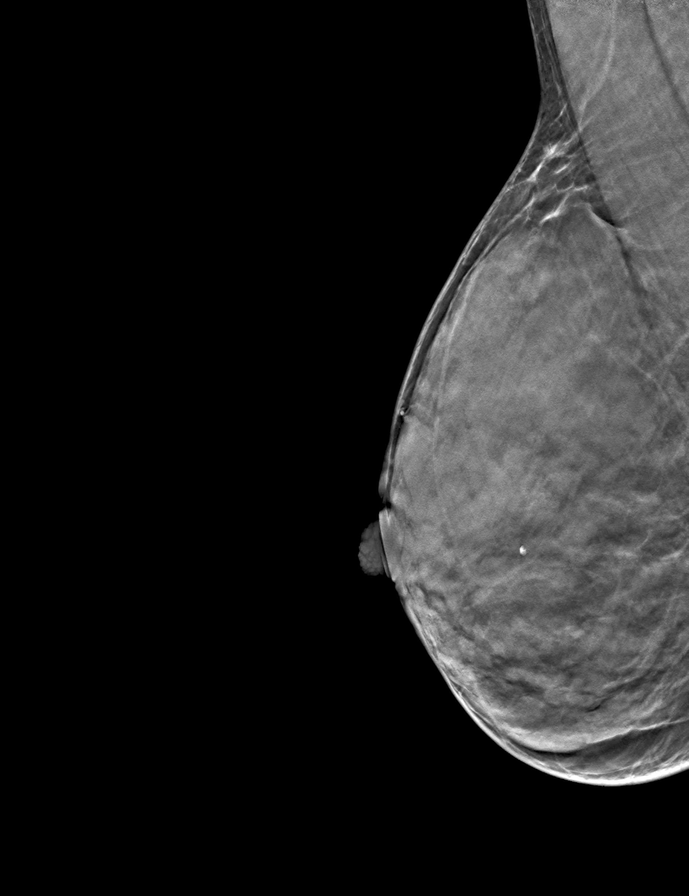

[L CC tomo · tomo slice 18/35.0]
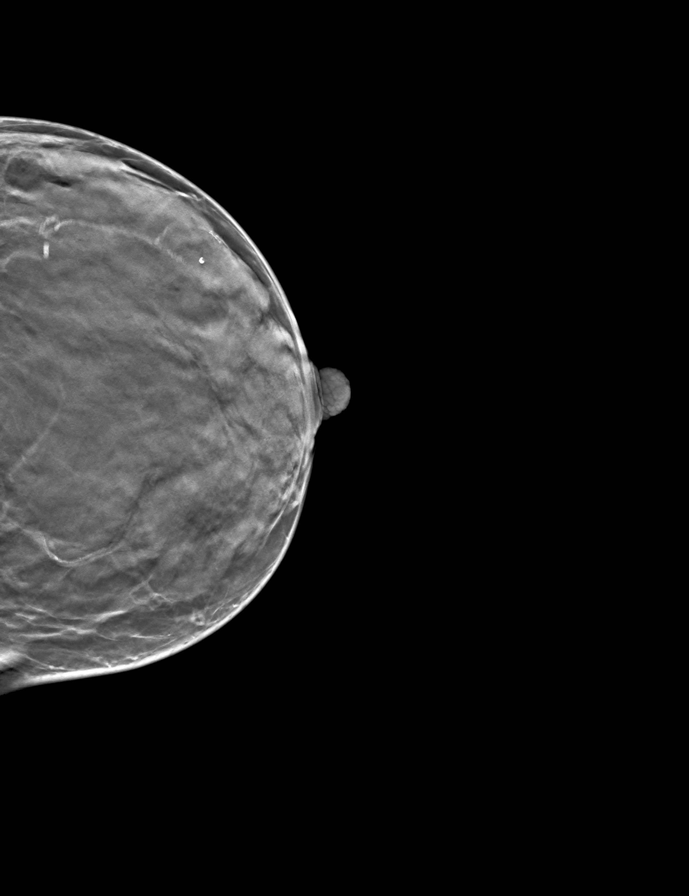

[L MLO tomo · tomo slice 18/35.0]
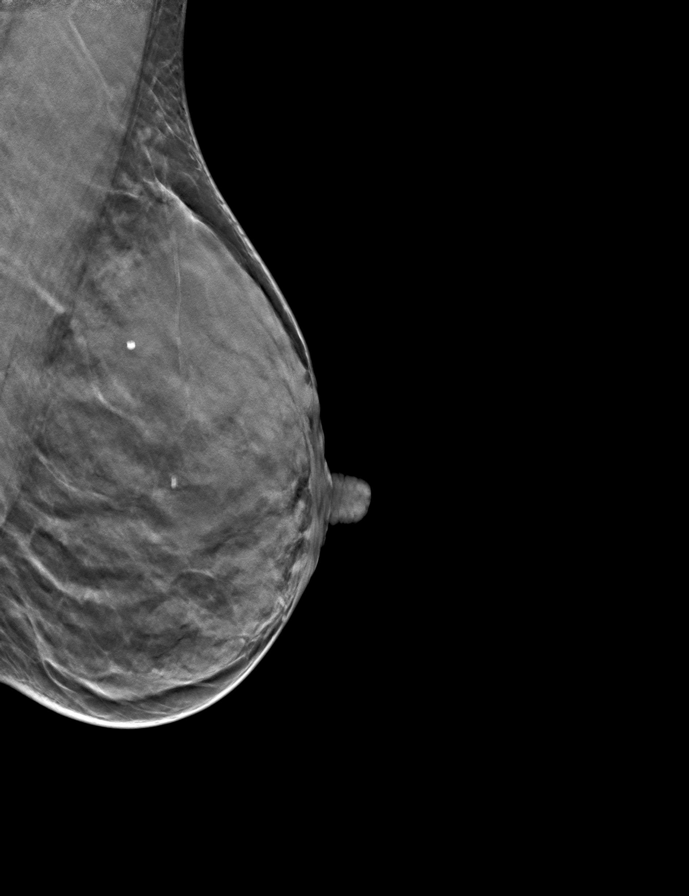

[9 of 24 positions shown; findings below may reference images not displayed]

ACR Breast Density Category d: The breast tissue is extremely dense,
which lowers the sensitivity of mammography
FINDINGS: There are no findings suspicious for malignancy.
IMPRESSION: No mammographic evidence of malignancy. A result letter of this
screening mammogram will be mailed directly to the patient.

RECOMMENDATION:
Screening mammogram in one year. (Code:TA-V-WV9)

BI-RADS CATEGORY  1: Negative.

## 2022-11-20 ENCOUNTER — Ambulatory Visit
Admission: EM | Admit: 2022-11-20 | Discharge: 2022-11-20 | Disposition: A | Discharge status: Home / Self Care | Payer: 59

## 2022-11-20 DIAGNOSIS — S29019A Strain of muscle and tendon of unspecified wall of thorax, initial encounter: Secondary | ICD-10-CM

## 2022-11-20 MED ORDER — CYCLOBENZAPRINE HCL 5 MG PO TABS: 5.0000 mg | ORAL_TABLET | Freq: Three times a day (TID) | ORAL | 0 refills | Status: AC | PRN

## 2022-11-20 NOTE — Discharge Instructions (Addendum)
Take home meds as directed, adding flexeril as label directed for muscle spasm(do not drink alcohol or drive while taking this medication). May use heat or ice to back for comfort. May use lidocaine patch or biofreeze for pain. Please follow up with PCP, may need to referral to physical therapy for further back pain management. GO immediately to ER for loss of bowel and bladder,loss of function, saddle numbness, etc.

## 2022-11-20 NOTE — ED Triage Notes (Signed)
Pt states she woke up in a panic and thinks she may have strained a muscle when she jumped up. She is now c/o of left lower back pain that radiates to abd.

## 2022-11-20 NOTE — ED Provider Notes (Signed)
[facility]    CSN: [id number] Arrival date & time: 11/20/22  0804      History   Chief Complaint Chief Complaint  Patient presents with   Back Pain    HPI Heidi Freeman is a 57 y.o. female.   57 year old female pt, Heidi Freeman, presents to Urgent care for evaluation of low back pain that started this am. Pt states she takes gabapentin for sciatica pain and jumped up as she was late for work and pulled a muscle in her back. No meds tried prior to arrival. Pt went to work works at [employer] lots of twisting and turning and pain was not improving so left week to be checked. Pt denies saddle numbness, weakness, or loss of bowel and bladder.   The history is provided by the patient. No language interpreter was used.    Past Medical History:  Diagnosis Date   Arthritis    lower back   Hypertension     Patient Active Problem List   Diagnosis Date Noted   Thoracic myofascial strain 11/20/2022    Past Surgical History:  Procedure Laterality Date   COLONOSCOPY     NASAL TURBINATE REDUCTION Bilateral 11/24/2020   Procedure: TURBINATE REDUCTION/SUBMUCOSAL RESECTION;  Surgeon: Linus Salmons, MD;  Location: [facility];  Service: ENT;  Laterality: Bilateral;   SEPTOPLASTY N/A 11/24/2020   Procedure: SEPTOPLASTY;  Surgeon: Linus Salmons, MD;  Location: [facility];  Service: ENT;  Laterality: N/A;    OB History   No obstetric history on file.      Home Medications    Prior to Admission medications   Medication Sig Start Date End Date Taking? Authorizing Provider  amLODipine (NORVASC) 5 MG tablet Take 1 tablet (5 mg total) by mouth 2 (two) times daily. Patient taking differently: Take 5 mg by mouth at bedtime. 12/14/14 11/20/22 Yes Governor Rooks, MD  carvedilol (COREG CR) 10 MG 24 hr capsule Take 10 mg by mouth daily.   Yes [provider]  cyclobenzaprine (FLEXERIL) 5 MG tablet Take 1 tablet (5 mg total) by mouth 3  (three) times daily as needed for up to 3 days for muscle spasms. 11/20/22 11/23/22 Yes Taunya Goral, Para March, NP  losartan-hydrochlorothiazide (HYZAAR) 100-12.5 MG tablet Take 1 tablet by mouth daily.   Yes [provider]  HYDROcodone-Acetaminophen 5-300 MG TABS Take 1-2 tablets by mouth every 4 (four) hours as needed. 11/24/20   Linus Salmons, MD  metoprolol tartrate (LOPRESSOR) 25 MG tablet Take 25 mg by mouth at bedtime.    [provider]  pantoprazole (PROTONIX) 40 MG tablet Take 40 mg by mouth daily.    [provider]  sulfamethoxazole-trimethoprim (BACTRIM DS) 800-160 MG tablet Take 1 tablet by mouth 2 (two) times daily. 11/24/20   Linus Salmons, MD    Family History Family History  Problem Relation Age of Onset   Breast cancer Cousin        pat cousin    Social History Social History   Tobacco Use   Smoking status: Former    Current packs/day: 0.00    Types: Cigarettes    Quit date: 01/07/2000    Years since quitting: 22.8   Smokeless tobacco: Never  Vaping Use   Vaping status: Never Used  Substance Use Topics   Alcohol use: Yes    Comment: rare   Drug use: No     Allergies   Patient has no known allergies.  Review of Systems Review of Systems  Constitutional:  Negative for fever.  Genitourinary:  Negative for dysuria.  Musculoskeletal:  Positive for myalgias.  All other systems reviewed and are negative.    Physical Exam Triage Vital Signs ED Triage Vitals  Encounter Vitals Group     BP      Systolic BP Percentile      Diastolic BP Percentile      Pulse      Resp      Temp      Temp src      SpO2      Weight      Height      Head Circumference      Peak Flow      Pain Score      Pain Loc      Pain Education      Exclude from Growth Chart    No data found.  Updated Vital Signs BP (!) 153/95   Pulse 68   Temp 98.2 F (36.8 C)   Resp 18   LMP 01/24/2018   SpO2 96%   Visual Acuity Right Eye Distance:    Left Eye Distance:   Bilateral Distance:    Right Eye Near:   Left Eye Near:    Bilateral Near:     Physical Exam Vitals and nursing note reviewed.  Constitutional:      Appearance: She is well-developed and well-groomed.  Cardiovascular:     Rate and Rhythm: Normal rate and regular rhythm.     Pulses: Normal pulses.     Heart sounds: Normal heart sounds.  Pulmonary:     Effort: Pulmonary effort is normal.     Breath sounds: Normal breath sounds and air entry.  Musculoskeletal:     Thoracic back: Spasms and tenderness present. No swelling, edema, deformity, signs of trauma, lacerations or bony tenderness. Normal range of motion. No scoliosis.       Back:     Comments: +TTP paraspinal muscle fibers. No bruising, pain is reproducible with palpation and movement.  Neurological:     General: No focal deficit present.     Mental Status: She is alert and oriented to person, place, and time.     GCS: GCS eye subscore is 4. GCS verbal subscore is 5. GCS motor subscore is 6.     Cranial Nerves: No cranial nerve deficit.     Sensory: No sensory deficit.  Psychiatric:        Attention and Perception: Attention normal.        Mood and Affect: Mood normal.        Speech: Speech normal.        Behavior: Behavior normal.      UC Treatments / Results  Labs (all labs ordered are listed, but only abnormal results are displayed) Labs Reviewed - No data to display  EKG   Radiology No results found.  Procedures Procedures (including critical care time)  Medications Ordered in UC Medications - No data to display  Initial Impression / Assessment and Plan / UC Course  I have reviewed the triage vital signs and the nursing notes.  Pertinent labs & imaging results that were available during my care of the patient were reviewed by me and considered in my medical decision making (see chart for details).    Discussed exam findings and plan of care with patient, strict go to ER  precautions given.   Patient verbalized understanding to this provider.  Work note given.  Ddx: Thoracic muscle strain, thoracic back pain, HTN Final Clinical Impressions(s) / UC Diagnoses   Final diagnoses:  Thoracic myofascial strain, initial encounter     Discharge Instructions      Take home meds as directed, adding flexeril as label directed for muscle spasm(do not drink alcohol or drive while taking this medication). May use heat or ice to back for comfort. May use lidocaine patch or biofreeze for pain. Please follow up with PCP, may need to referral to physical therapy for further back pain management. GO immediately to ER for loss of bowel and bladder,loss of function, saddle numbness, etc.      ED Prescriptions     Medication Sig Dispense Auth. Provider   cyclobenzaprine (FLEXERIL) 5 MG tablet Take 1 tablet (5 mg total) by mouth 3 (three) times daily as needed for up to 3 days for muscle spasms. 10 tablet Belanna Manring, Para March, NP      PDMP not reviewed this encounter.   Clancy Gourd, NP 11/20/22 1011

## 2022-12-06 ENCOUNTER — Other Ambulatory Visit (INDEPENDENT_AMBULATORY_CARE_PROVIDER_SITE_OTHER): Payer: Self-pay | Admitting: Nurse Practitioner

## 2022-12-06 DIAGNOSIS — M79605 Pain in left leg: Secondary | ICD-10-CM

## 2022-12-13 ENCOUNTER — Ambulatory Visit (INDEPENDENT_AMBULATORY_CARE_PROVIDER_SITE_OTHER): Payer: Self-pay

## 2022-12-13 ENCOUNTER — Encounter (INDEPENDENT_AMBULATORY_CARE_PROVIDER_SITE_OTHER): Payer: Self-pay | Admitting: Nurse Practitioner

## 2022-12-13 ENCOUNTER — Ambulatory Visit (INDEPENDENT_AMBULATORY_CARE_PROVIDER_SITE_OTHER): Payer: 59 | Admitting: Nurse Practitioner

## 2022-12-13 VITALS — BP 159/104 | HR 65 | Resp 16 | Wt 117.8 lb

## 2022-12-13 DIAGNOSIS — M79605 Pain in left leg: Secondary | ICD-10-CM

## 2022-12-16 LAB — VAS US ABI WITH/WO TBI
Left ABI: 1.01
Right ABI: 1.18

## 2022-12-17 ENCOUNTER — Encounter (INDEPENDENT_AMBULATORY_CARE_PROVIDER_SITE_OTHER): Payer: Self-pay | Admitting: Nurse Practitioner

## 2022-12-18 ENCOUNTER — Other Ambulatory Visit: Payer: Self-pay | Admitting: Internal Medicine

## 2022-12-18 DIAGNOSIS — Z1231 Encounter for screening mammogram for malignant neoplasm of breast: Secondary | ICD-10-CM

## 2022-12-18 NOTE — Progress Notes (Signed)
Subjective:    Patient ID: Heidi Freeman, female    DOB: 30-Jul-1965, 57 y.o.   MRN: [id number] Chief Complaint  Patient presents with   New Patient (Initial Visit)    Ref Heidi Freeman consult left leg pain    The patient presents today for evaluation of pain in her left leg.  For about the last year or so she has had abdominal and back pain radiating into the left leg.  The patient feels that this is related to her sciatica.  She has claudication-like symptoms that are not always consistent with activity.  She denies any rest pain or open wounds or ulcerations.  She previously saw physiatry about a year ago and it was recommended that she follow-up in 6 to 8 weeks if she continues to have pain and issues for an MRI to evaluate for her back pain.  Fortunately patient has not been able to follow-up with her office however.  Today her noninvasive study showed ABI 1.19 on the right and 1.1 on the left.  Her TBI's are normal bilaterally with strong triphasic tibial artery waveforms and good toe waveforms bilaterally.    Review of Systems  Musculoskeletal:  Positive for arthralgias.  All other systems reviewed and are negative.      Objective:   Physical Exam Vitals reviewed.  HENT:     Head: Normocephalic.  Cardiovascular:     Rate and Rhythm: Normal rate.  Pulmonary:     Effort: Pulmonary effort is normal.  Skin:    General: Skin is warm and dry.  Neurological:     Mental Status: She is alert and oriented to person, place, and time.  Psychiatric:        Mood and Affect: Mood normal.        Behavior: Behavior normal.        Thought Content: Thought content normal.        Judgment: Judgment normal.     BP (!) 159/104 (BP Location: Left Arm)   Pulse 65   Resp 16   Wt 117 lb 12.8 oz (53.4 kg)   LMP 01/24/2018   BMI 21.55 kg/m   Past Medical History:  Diagnosis Date   Arthritis    lower back   Hypertension     Social History   Socioeconomic History   Marital status:  Single    Spouse name: Not on file   Number of children: Not on file   Years of education: Not on file   Highest education level: Not on file  Occupational History   Not on file  Tobacco Use   Smoking status: Former    Current packs/day: 0.00    Types: Cigarettes    Quit date: 01/07/2000    Years since quitting: 22.9   Smokeless tobacco: Never  Vaping Use   Vaping status: Never Used  Substance and Sexual Activity   Alcohol use: Yes    Comment: rare   Drug use: No   Sexual activity: Not on file  Other Topics Concern   Not on file  Social History Narrative   Not on file   Social Determinants of Health   Financial Resource Strain: Not on file  Food Insecurity: Not on file  Transportation Needs: Not on file  Physical Activity: Not on file  Stress: Not on file  Social Connections: Not on file  Intimate Partner Violence: Not on file    Past Surgical History:  Procedure Laterality Date   COLONOSCOPY  NASAL TURBINATE REDUCTION Bilateral 11/24/2020   Procedure: TURBINATE REDUCTION/SUBMUCOSAL RESECTION;  Surgeon: Heidi Salmons, MD;  Location: [facility];  Service: ENT;  Laterality: Bilateral;   SEPTOPLASTY N/A 11/24/2020   Procedure: SEPTOPLASTY;  Surgeon: Heidi Salmons, MD;  Location: [facility];  Service: ENT;  Laterality: N/A;    Family History  Problem Relation Age of Onset   Hypertension Mother    Stroke Father    Diabetes Father    Dementia Father    Vascular Disease Father    Breast cancer Cousin        pat cousin    No Known Allergies     Latest Ref Rng & Units 12/14/2014    7:41 PM  CBC  WBC 3.6 - 11.0 K/uL 7.2   Hemoglobin 12.0 - 16.0 g/dL 16.1   Hematocrit 09.6 - 47.0 % 36.0   Platelets 150 - 440 K/uL 264       CMP     Component Value Date/Time   NA 132 (L) 12/14/2014 1941   K 3.4 (L) 12/14/2014 1941   CL 100 (L) 12/14/2014 1941   CO2 27 12/14/2014 1941   GLUCOSE 104 (H) 12/14/2014 1941   BUN 16 12/14/2014 1941    CREATININE 0.85 12/14/2014 1941   CALCIUM 8.8 (L) 12/14/2014 1941   GFRNONAA >60 12/14/2014 1941     VAS [place] ABI WITH/WO TBI  Result Date: 12/16/2022  LOWER EXTREMITY DOPPLER STUDY Patient Name:  Heidi Freeman  Date of Exam:   12/13/2022 Medical Rec #: [id number]        Accession #:    [id number] Date of Birth: 27-Dec-1965        Patient Gender: F Patient Age:   32 years Exam Location:  [facility] Procedure:      VAS [place] ABI WITH/WO TBI Referring Phys: Heidi Freeman --------------------------------------------------------------------------------  High Risk Factors: Hypertension, past history of smoking. Other Factors: One year of abdominal /back pain radiating into left leg, which                patient attributes to sciatica.  Performing Technologist: Hardie Lora RVT  Examination Guidelines: A complete evaluation includes at minimum, Doppler waveform signals and systolic blood pressure reading at the level of bilateral brachial, anterior tibial, and posterior tibial arteries, when vessel segments are accessible. Bilateral testing is considered an integral part of a complete examination. Photoelectric Plethysmograph (PPG) waveforms and toe systolic pressure readings are included as required and additional duplex testing as needed. Limited examinations for reoccurring indications may be performed as noted.  ABI Findings: +---------+------------------+-----+---------+--------+ Right    Rt Pressure (mmHg)IndexWaveform Comment  +---------+------------------+-----+---------+--------+ Brachial 148                                      +---------+------------------+-----+---------+--------+ PTA      158               1.04 triphasic         +---------+------------------+-----+---------+--------+ DP       179               1.18 triphasic         +---------+------------------+-----+---------+--------+ Great Toe127               0.84                    +---------+------------------+-----+---------+--------+ +---------+------------------+-----+---------+-------+  Left     Lt Pressure (mmHg)IndexWaveform Comment +---------+------------------+-----+---------+-------+ Brachial 152                                     +---------+------------------+-----+---------+-------+ PTA      145               0.95 triphasic        +---------+------------------+-----+---------+-------+ DP       154               1.01 triphasic        +---------+------------------+-----+---------+-------+ Great Toe130               0.86                  +---------+------------------+-----+---------+-------+ +-------+-----------+-----------+------------+------------+ ABI/TBIToday's ABIToday's TBIPrevious ABIPrevious TBI +-------+-----------+-----------+------------+------------+ Right  1.18       0.84                                +-------+-----------+-----------+------------+------------+ Left   1.01       0.86                                +-------+-----------+-----------+------------+------------+  Summary: Right: Resting right ankle-brachial index is within normal range. The right toe-brachial index is normal. Left: Resting left ankle-brachial index is within normal range. The left toe-brachial index is normal. *See table(s) above for measurements and observations.  Electronically signed by Festus Barren MD on 12/16/2022 at 9:23:26 AM.    Final        Assessment & Plan:   1. Left leg pain Recommend:  The patient has atypical pain symptoms for vascular disease and on exam I do not find evidence of vascular pathology that would explain the patient's symptoms.  Noninvasive studies do not identify significant vascular problems  I have recommended that the patient reach out to physiatry in order to follow-up as I believe that her symptoms are related to her lower back.   Patient will follow-up with me on a PRN basis.   Current Outpatient  Medications on File Prior to Visit  Medication Sig Dispense Refill   carvedilol (COREG CR) 10 MG 24 hr capsule Take 10 mg by mouth daily.     losartan-hydrochlorothiazide (HYZAAR) 100-12.5 MG tablet Take 1 tablet by mouth daily.     pantoprazole (PROTONIX) 40 MG tablet Take 40 mg by mouth daily.     amLODipine (NORVASC) 5 MG tablet Take 1 tablet (5 mg total) by mouth 2 (two) times daily. (Patient taking differently: Take 5 mg by mouth at bedtime.) 60 tablet 0   HYDROcodone-Acetaminophen 5-300 MG TABS Take 1-2 tablets by mouth every 4 (four) hours as needed. (Patient not taking: Reported on 12/13/2022) 40 tablet 0   metoprolol tartrate (LOPRESSOR) 25 MG tablet Take 25 mg by mouth at bedtime. (Patient not taking: Reported on 12/13/2022)     sulfamethoxazole-trimethoprim (BACTRIM DS) 800-160 MG tablet Take 1 tablet by mouth 2 (two) times daily. (Patient not taking: Reported on 12/13/2022) 20 tablet 0   No current facility-administered medications on file prior to visit.    There are no Patient Instructions on file for this visit. No follow-ups on file.   Georgiana Spinner, NP

## 2023-01-01 ENCOUNTER — Ambulatory Visit
Admission: RE | Admit: 2023-01-01 | Discharge: 2023-01-01 | Disposition: A | Discharge status: Home / Self Care | Payer: 59 | Source: Ambulatory Visit | Attending: Internal Medicine | Admitting: Internal Medicine

## 2023-01-01 DIAGNOSIS — Z1231 Encounter for screening mammogram for malignant neoplasm of breast: Secondary | ICD-10-CM | POA: Diagnosis present

## 2023-05-16 DIAGNOSIS — I10 Essential (primary) hypertension: Secondary | ICD-10-CM | POA: Diagnosis not present

## 2023-05-16 DIAGNOSIS — M5442 Lumbago with sciatica, left side: Secondary | ICD-10-CM | POA: Diagnosis not present

## 2023-05-16 DIAGNOSIS — R7303 Prediabetes: Secondary | ICD-10-CM | POA: Diagnosis not present

## 2023-05-16 DIAGNOSIS — M542 Cervicalgia: Secondary | ICD-10-CM | POA: Diagnosis not present

## 2023-06-23 DIAGNOSIS — M79605 Pain in left leg: Secondary | ICD-10-CM | POA: Diagnosis not present

## 2023-06-23 DIAGNOSIS — M545 Low back pain, unspecified: Secondary | ICD-10-CM | POA: Diagnosis not present

## 2023-06-26 ENCOUNTER — Other Ambulatory Visit: Payer: Self-pay | Admitting: Internal Medicine

## 2023-06-26 DIAGNOSIS — M545 Low back pain, unspecified: Secondary | ICD-10-CM

## 2023-07-03 ENCOUNTER — Encounter: Payer: Self-pay | Admitting: Internal Medicine

## 2023-07-06 DIAGNOSIS — M549 Dorsalgia, unspecified: Secondary | ICD-10-CM | POA: Diagnosis not present

## 2023-07-06 DIAGNOSIS — M5442 Lumbago with sciatica, left side: Secondary | ICD-10-CM | POA: Diagnosis not present

## 2023-07-11 ENCOUNTER — Ambulatory Visit
Admission: RE | Admit: 2023-07-11 | Discharge: 2023-07-11 | Disposition: A | Discharge status: Home / Self Care | Source: Ambulatory Visit | Attending: Internal Medicine | Admitting: Internal Medicine

## 2023-07-11 DIAGNOSIS — M545 Low back pain, unspecified: Secondary | ICD-10-CM

## 2023-07-11 DIAGNOSIS — M47816 Spondylosis without myelopathy or radiculopathy, lumbar region: Secondary | ICD-10-CM | POA: Diagnosis not present

## 2023-07-11 DIAGNOSIS — M5126 Other intervertebral disc displacement, lumbar region: Secondary | ICD-10-CM | POA: Diagnosis not present

## 2023-07-11 MED ORDER — GADOPICLENOL 0.5 MMOL/ML IV SOLN
7.5000 mL | Freq: Once | INTRAVENOUS | Status: AC | PRN
  Administered 2023-07-11: 7.5 mL via INTRAVENOUS

## 2023-07-16 DIAGNOSIS — M5416 Radiculopathy, lumbar region: Secondary | ICD-10-CM | POA: Diagnosis not present

## 2023-07-16 DIAGNOSIS — M1612 Unilateral primary osteoarthritis, left hip: Secondary | ICD-10-CM | POA: Diagnosis not present

## 2023-08-13 DIAGNOSIS — M1612 Unilateral primary osteoarthritis, left hip: Secondary | ICD-10-CM | POA: Diagnosis not present

## 2023-09-16 DIAGNOSIS — R0789 Other chest pain: Secondary | ICD-10-CM | POA: Diagnosis not present

## 2023-09-16 DIAGNOSIS — I10 Essential (primary) hypertension: Secondary | ICD-10-CM | POA: Diagnosis not present

## 2023-09-16 DIAGNOSIS — R002 Palpitations: Secondary | ICD-10-CM | POA: Diagnosis not present

## 2023-09-16 DIAGNOSIS — R0602 Shortness of breath: Secondary | ICD-10-CM | POA: Diagnosis not present

## 2023-09-16 DIAGNOSIS — Z01818 Encounter for other preprocedural examination: Secondary | ICD-10-CM | POA: Diagnosis not present

## 2023-09-22 DIAGNOSIS — R0789 Other chest pain: Secondary | ICD-10-CM | POA: Diagnosis not present

## 2023-09-22 DIAGNOSIS — R0602 Shortness of breath: Secondary | ICD-10-CM | POA: Diagnosis not present

## 2023-09-29 DIAGNOSIS — R002 Palpitations: Secondary | ICD-10-CM | POA: Diagnosis not present

## 2023-09-29 DIAGNOSIS — R0602 Shortness of breath: Secondary | ICD-10-CM | POA: Diagnosis not present

## 2023-09-29 DIAGNOSIS — Z01818 Encounter for other preprocedural examination: Secondary | ICD-10-CM | POA: Diagnosis not present

## 2023-09-29 DIAGNOSIS — I10 Essential (primary) hypertension: Secondary | ICD-10-CM | POA: Diagnosis not present

## 2023-10-15 ENCOUNTER — Other Ambulatory Visit: Payer: Self-pay | Admitting: Orthopedic Surgery

## 2023-10-15 DIAGNOSIS — I10 Essential (primary) hypertension: Secondary | ICD-10-CM | POA: Diagnosis not present

## 2023-10-15 DIAGNOSIS — M1612 Unilateral primary osteoarthritis, left hip: Secondary | ICD-10-CM | POA: Diagnosis not present

## 2023-10-15 DIAGNOSIS — R7303 Prediabetes: Secondary | ICD-10-CM | POA: Diagnosis not present

## 2023-10-21 ENCOUNTER — Other Ambulatory Visit: Payer: Self-pay

## 2023-10-21 ENCOUNTER — Encounter
Admission: RE | Admit: 2023-10-21 | Discharge: 2023-10-21 | Disposition: A | Discharge status: Home / Self Care | Source: Ambulatory Visit | Attending: Orthopedic Surgery | Admitting: Orthopedic Surgery

## 2023-10-21 VITALS — BP 152/94 | HR 82 | Resp 18 | Ht 60.0 in | Wt 131.0 lb

## 2023-10-21 DIAGNOSIS — Z01818 Encounter for other preprocedural examination: Secondary | ICD-10-CM | POA: Insufficient documentation

## 2023-10-21 DIAGNOSIS — Z01812 Encounter for preprocedural laboratory examination: Secondary | ICD-10-CM | POA: Diagnosis present

## 2023-10-21 DIAGNOSIS — I10 Essential (primary) hypertension: Secondary | ICD-10-CM | POA: Diagnosis not present

## 2023-10-21 DIAGNOSIS — Z01810 Encounter for preprocedural cardiovascular examination: Secondary | ICD-10-CM | POA: Diagnosis not present

## 2023-10-21 LAB — TYPE AND SCREEN
ABO/RH(D): A POS
Antibody Screen: NEGATIVE

## 2023-10-21 LAB — URINALYSIS, COMPLETE (UACMP) WITH MICROSCOPIC
Bacteria, UA: NONE SEEN
Bilirubin Urine: NEGATIVE
Glucose, UA: NEGATIVE mg/dL
Hgb urine dipstick: NEGATIVE
Ketones, ur: NEGATIVE mg/dL
Leukocytes,Ua: NEGATIVE
Nitrite: NEGATIVE
Protein, ur: NEGATIVE mg/dL
Specific Gravity, Urine: 1.017 (ref 1.005–1.030)
pH: 6 (ref 5.0–8.0)

## 2023-10-21 LAB — SURGICAL PCR SCREEN
MRSA, PCR: NEGATIVE
Staphylococcus aureus: NEGATIVE

## 2023-10-21 NOTE — Patient Instructions (Addendum)
 Your procedure is scheduled on: Monday 10/27/23 Report to the Registration Desk on the 1st floor of the [facility]. To find out your arrival time, please call [phone] between 1PM - 3PM on: Friday 10/24/23 If your arrival time is 6:00 am, do not arrive before that time as the [facility] entrance doors do not open until 6:00 am.  REMEMBER: Instructions that are not followed completely may result in serious medical risk, up to and including death; or upon the discretion of your surgeon and anesthesiologist your surgery may need to be rescheduled.  Do not eat food after midnight the night before surgery.  No gum chewing or hard candies.  You may however, drink CLEAR liquids up to 2 hours before you are scheduled to arrive for your surgery. Do not drink anything within 2 hours of your scheduled arrival time.  Clear liquids include: - water  - apple juice without pulp - gatorade (not RED colors) - black coffee or tea (Do NOT add milk or creamers to the coffee or tea) Do NOT drink anything that is not on this list.  In addition, your doctor has ordered for you to drink the provided:  Ensure Pre-Surgery Clear Carbohydrate Drink  Drinking this carbohydrate drink up to two hours before surgery helps to reduce insulin resistance and improve patient outcomes. Please complete drinking 2 hours before scheduled arrival time.  One week prior to surgery: Stop Anti-inflammatories (NSAIDS) such as Advil, Aleve, Ibuprofen, Motrin, Naproxen, Naprosyn and Aspirin based products such as Excedrin, Goody's Powder, BC Powder. You may however, continue to take Tylenol  if needed for pain up until the day of surgery.  Stop ALL OVER THE COUNTER supplements and vitamins until after surgery.  Continue taking all of your other prescription medications up until the day of surgery.  ON THE DAY OF SURGERY ONLY TAKE THESE MEDICATIONS WITH SIPS OF WATER:  amLODipine  (NORVASC ) 5 MG tablet  carvedilol  (COREG ) 25  MG tablet   No Alcohol for 24 hours before or after surgery.  No Smoking including e-cigarettes for 24 hours before surgery.  No chewable tobacco products for at least 6 hours before surgery.  No nicotine patches on the day of surgery.  Do not use any recreational drugs for at least a week (preferably 2 weeks) before your surgery.  Please be advised that the combination of cocaine and anesthesia may have negative outcomes, up to and including death. If you test positive for cocaine, your surgery will be cancelled.  On the morning of surgery brush your teeth with toothpaste and water, you may rinse your mouth with mouthwash if you wish. Do not swallow any toothpaste or mouthwash.  Do not shave body hair from the neck down 48 hours before surgery.  Use CHG Soap or wipes as directed on instruction sheet.  Do not wear lotions, powders, or perfumes on the day of your surgery  Wear comfortable clothing (specific to your surgery type) to the hospital.  Do not wear jewelry, make-up, hairpins, clips or toenail polish.  For welded (permanent) jewelry: bracelets, anklets, waist bands, etc.  Please have this removed prior to surgery.  If it is not removed, there is a chance that hospital personnel will need to cut it off on the day of surgery.  Contact lenses, hearing aids and dentures may not be worn into surgery. Bring a case for your glasses  Do not bring valuables to the hospital. [facility] is not responsible for any missing/lost belongings or valuables.  Notify your doctor if there is any change in your medical condition (cold, fever, infection).  After surgery, you can help prevent lung complications by doing breathing exercises.  Take deep breaths and cough every 1-2 hours. Your doctor may order a device called an Incentive Spirometer to help you take deep breaths.  If you are being admitted to the hospital overnight, leave your suitcase in the car. After surgery it may be brought  to your room.  In case of increased patient census, it may be necessary for you, the patient, to continue your postoperative care in the Same Day Surgery department.  If you are being discharged the day of surgery, you will not be allowed to drive home. You will need a responsible individual to drive you home and stay with you for 24 hours after surgery.   Please call the Pre-admissions Testing Dept. at [phone] if you have any questions about these instructions.  Surgery Visitation Policy:  Patients having surgery or a procedure may have two visitors.  Children under the age of 86 must have an adult with them who is not the patient.  Inpatient Visitation:    Visiting hours are 7 a.m. to 8 p.m. Up to four visitors are allowed at one time in a patient room. The visitors may rotate out with other people during the day.  One visitor age 29 or older may stay with the patient overnight and must be in the room by 8 p.m.   Merchandiser, retail to address health-related social needs:  [email or web address]    Pre-operative 5 CHG Bath Instructions   You can play a key role in reducing the risk of infection after surgery. Your skin needs to be as free of germs as possible. You can reduce the number of germs on your skin by washing with CHG (chlorhexidine  gluconate) soap before surgery. CHG is an antiseptic soap that kills germs and continues to kill germs even after washing.   DO NOT use if you have an allergy to chlorhexidine /CHG or antibacterial soaps. If your skin becomes reddened or irritated, stop using the CHG and notify one of our RNs at [phone].   Please shower with the CHG soap starting 4 days before surgery using the following schedule:   Thursday 10/23/23 - Monday 10/27/23    Please keep in mind the following:  DO NOT shave, including legs and underarms, starting the day of your first shower.   You may shave your face at any point before/day of  surgery.  Place clean sheets on your bed the day you start using CHG soap. Use a clean washcloth (not used since being washed) for each shower. DO NOT sleep with pets once you start using the CHG.   CHG Shower Instructions:  If you choose to wash your hair and private area, wash first with your normal shampoo/soap.  After you use shampoo/soap, rinse your hair and body thoroughly to remove shampoo/soap residue.  Turn the water OFF and apply about 3 tablespoons (45 ml) of CHG soap to a CLEAN washcloth.  Apply CHG soap ONLY FROM YOUR NECK DOWN TO YOUR TOES (washing for 3-5 minutes)  DO NOT use CHG soap on face, private areas, open wounds, or sores.  Pay special attention to the area where your surgery is being performed.  If you are having back surgery, having someone wash your back for you may be helpful. Wait 2 minutes after CHG soap is applied, then you may rinse off the CHG  soap.  Pat dry with a clean towel  Put on clean clothes/pajamas   If you choose to wear lotion, please use ONLY the CHG-compatible lotions on the back of this paper.     Additional instructions for the day of surgery: DO NOT APPLY any lotions, deodorants, cologne, or perfumes.   Put on clean/comfortable clothes.  Brush your teeth.  Ask your nurse before applying any prescription medications to the skin.      CHG Compatible Lotions   Aveeno Moisturizing lotion  Cetaphil Moisturizing Cream  Cetaphil Moisturizing Lotion  Clairol Herbal Essence Moisturizing Lotion, Dry Skin  Clairol Herbal Essence Moisturizing Lotion, Extra Dry Skin  Clairol Herbal Essence Moisturizing Lotion, Normal Skin  Curel Age Defying Therapeutic Moisturizing Lotion with Alpha Hydroxy  Curel Extreme Care Body Lotion  Curel Soothing Hands Moisturizing Hand Lotion  Curel Therapeutic Moisturizing Cream, Fragrance-Free  Curel Therapeutic Moisturizing Lotion, Fragrance-Free  Curel Therapeutic Moisturizing Lotion, Original Formula  Eucerin  Daily Replenishing Lotion  Eucerin Dry Skin Therapy Plus Alpha Hydroxy Crme  Eucerin Dry Skin Therapy Plus Alpha Hydroxy Lotion  Eucerin Original Crme  Eucerin Original Lotion  Eucerin Plus Crme Eucerin Plus Lotion  Eucerin TriLipid Replenishing Lotion  Keri Anti-Bacterial Hand Lotion  Keri Deep Conditioning Original Lotion Dry Skin Formula Softly Scented  Keri Deep Conditioning Original Lotion, Fragrance Free Sensitive Skin Formula  Keri Lotion Fast Absorbing Fragrance Free Sensitive Skin Formula  Keri Lotion Fast Absorbing Softly Scented Dry Skin Formula  Keri Original Lotion  Keri Skin Renewal Lotion Keri Silky Smooth Lotion  Keri Silky Smooth Sensitive Skin Lotion  Nivea Body Creamy Conditioning Oil  Nivea Body Extra Enriched Lotion  Nivea Body Original Lotion  Nivea Body Sheer Moisturizing Lotion Nivea Crme  Nivea Skin Firming Lotion  NutraDerm 30 Skin Lotion  NutraDerm Skin Lotion  NutraDerm Therapeutic Skin Cream  NutraDerm Therapeutic Skin Lotion  ProShield Protective Hand Cream  Provon moisturizing lotion  How to Use an Incentive Spirometer  An incentive spirometer is a tool that measures how well you are filling your lungs with each breath. Learning to take long, deep breaths using this tool can help you keep your lungs clear and active. This may help to reverse or lessen your chance of developing breathing (pulmonary) problems, especially infection. You may be asked to use a spirometer: After a surgery. If you have a lung problem or a history of smoking. After a long period of time when you have been unable to move or be active. If the spirometer includes an indicator to show the highest number that you have reached, your health care provider or respiratory therapist will help you set a goal. Keep a log of your progress as told by your health care provider. What are the risks? Breathing too quickly may cause dizziness or cause you to pass out. Take your time so you  do not get dizzy or light-headed. If you are in pain, you may need to take pain medicine before doing incentive spirometry. It is harder to take a deep breath if you are having pain. How to use your incentive spirometer  Sit up on the edge of your bed or on a chair. Hold the incentive spirometer so that it is in an upright position. Before you use the spirometer, breathe out normally. Place the mouthpiece in your mouth. Make sure your lips are closed tightly around it. Breathe in slowly and as deeply as you can through your mouth, causing the piston or the ball  to rise toward the top of the chamber. Hold your breath for 3-5 seconds, or for as long as possible. If the spirometer includes a coach indicator, use this to guide you in breathing. Slow down your breathing if the indicator goes above the marked areas. Remove the mouthpiece from your mouth and breathe out normally. The piston or ball will return to the bottom of the chamber. Rest for a few seconds, then repeat the steps 10 or more times. Take your time and take a few normal breaths between deep breaths so that you do not get dizzy or light-headed. Do this every 1-2 hours when you are awake. If the spirometer includes a goal marker to show the highest number you have reached (best effort), use this as a goal to work toward during each repetition. After each set of 10 deep breaths, cough a few times. This will help to make sure that your lungs are clear. If you have an incision on your chest or abdomen from surgery, place a pillow or a rolled-up towel firmly against the incision when you cough. This can help to reduce pain while taking deep breaths and coughing. General tips When you are able to get out of bed: Walk around often. Continue to take deep breaths and cough in order to clear your lungs. Keep using the incentive spirometer until your health care provider says it is okay to stop using it. If you have been in the hospital, you  may be told to keep using the spirometer at home. Contact a health care provider if: You are having difficulty using the spirometer. You have trouble using the spirometer as often as instructed. Your pain medicine is not giving enough relief for you to use the spirometer as told. You have a fever. Get help right away if: You develop shortness of breath. You develop a cough with bloody mucus from the lungs. You have fluid or blood coming from an incision site after you cough. Summary An incentive spirometer is a tool that can help you learn to take long, deep breaths to keep your lungs clear and active. You may be asked to use a spirometer after a surgery, if you have a lung problem or a history of smoking, or if you have been inactive for a long period of time. Use your incentive spirometer as instructed every 1-2 hours while you are awake. If you have an incision on your chest or abdomen, place a pillow or a rolled-up towel firmly against your incision when you cough. This will help to reduce pain. Get help right away if you have shortness of breath, you cough up bloody mucus, or blood comes from your incision when you cough. This information is not intended to replace advice given to you by your health care provider. Make sure you discuss any questions you have with your health care provider. Document Revised: 05/17/2019 Document Reviewed: 05/17/2019 Elsevier Patient Education  2023 [employer].

## 2023-10-27 ENCOUNTER — Encounter: Payer: Self-pay | Admitting: Orthopedic Surgery

## 2023-10-27 ENCOUNTER — Ambulatory Visit: Payer: Self-pay | Admitting: Urgent Care

## 2023-10-27 ENCOUNTER — Other Ambulatory Visit: Payer: Self-pay

## 2023-10-27 ENCOUNTER — Encounter
Admission: RE | Disposition: A | Discharge status: Home Health | Payer: Self-pay | Source: Home / Self Care | Attending: Orthopedic Surgery

## 2023-10-27 ENCOUNTER — Ambulatory Visit

## 2023-10-27 ENCOUNTER — Ambulatory Visit
Admission: RE | Admit: 2023-10-27 | Discharge: 2023-10-28 | Disposition: A | Discharge status: Home Health | Attending: Orthopedic Surgery | Admitting: Orthopedic Surgery

## 2023-10-27 DIAGNOSIS — Z96642 Presence of left artificial hip joint: Secondary | ICD-10-CM | POA: Diagnosis not present

## 2023-10-27 DIAGNOSIS — Z79899 Other long term (current) drug therapy: Secondary | ICD-10-CM | POA: Insufficient documentation

## 2023-10-27 DIAGNOSIS — D62 Acute posthemorrhagic anemia: Secondary | ICD-10-CM | POA: Diagnosis not present

## 2023-10-27 DIAGNOSIS — M1612 Unilateral primary osteoarthritis, left hip: Secondary | ICD-10-CM | POA: Insufficient documentation

## 2023-10-27 DIAGNOSIS — I10 Essential (primary) hypertension: Secondary | ICD-10-CM | POA: Insufficient documentation

## 2023-10-27 HISTORY — PX: TOTAL HIP ARTHROPLASTY: SHX124

## 2023-10-27 LAB — ABO/RH: ABO/RH(D): A POS

## 2023-10-27 SURGERY — ARTHROPLASTY, HIP, TOTAL, ANTERIOR APPROACH
Anesthesia: Spinal | Site: Hip | Laterality: Left

## 2023-10-27 MED ORDER — CARVEDILOL 3.125 MG PO TABS
12.5000 mg | ORAL_TABLET | Freq: Two times a day (BID) | ORAL | Status: DC
  Administered 2023-10-27 – 2023-10-28 (×2): 12.5 mg via ORAL
  Filled 2023-10-27 (×2): qty 4

## 2023-10-27 MED ORDER — BUPIVACAINE-EPINEPHRINE (PF) 0.25% -1:200000 IJ SOLN
INTRAMUSCULAR | Status: AC
Start: 2023-10-27 — End: 2023-10-27
  Filled 2023-10-27: qty 30

## 2023-10-27 MED ORDER — PHENOL 1.4 % MT LIQD: 1.0000 | OROMUCOSAL | Status: DC | PRN

## 2023-10-27 MED ORDER — DEXAMETHASONE SODIUM PHOSPHATE 10 MG/ML IJ SOLN
INTRAMUSCULAR | Status: DC | PRN
Start: 2023-10-27 — End: 2023-10-27
  Administered 2023-10-27: 10 mg via INTRAVENOUS

## 2023-10-27 MED ORDER — BUPIVACAINE LIPOSOME 1.3 % IJ SUSP
INTRAMUSCULAR | Status: AC
  Filled 2023-10-27: qty 20

## 2023-10-27 MED ORDER — ONDANSETRON HCL 4 MG/2ML IJ SOLN: 4.0000 mg | Freq: Four times a day (QID) | INTRAMUSCULAR | Status: DC | PRN

## 2023-10-27 MED ORDER — MORPHINE SULFATE (PF) 2 MG/ML IV SOLN: 0.5000 mg | INTRAVENOUS | Status: DC | PRN

## 2023-10-27 MED ORDER — BUPIVACAINE-EPINEPHRINE (PF) 0.25% -1:200000 IJ SOLN
INTRAMUSCULAR | Status: DC | PRN
  Administered 2023-10-27: 20 mL

## 2023-10-27 MED ORDER — TRANEXAMIC ACID-NACL 1000-0.7 MG/100ML-% IV SOLN
INTRAVENOUS | Status: AC
  Filled 2023-10-27: qty 100

## 2023-10-27 MED ORDER — OXYCODONE HCL 5 MG/5ML PO SOLN: 5.0000 mg | Freq: Once | ORAL | Status: AC | PRN

## 2023-10-27 MED ORDER — TRAMADOL HCL 50 MG PO TABS: 50.0000 mg | ORAL_TABLET | Freq: Four times a day (QID) | ORAL | Status: DC | PRN

## 2023-10-27 MED ORDER — CHLORHEXIDINE GLUCONATE 0.12 % MT SOLN
OROMUCOSAL | Status: AC
  Filled 2023-10-27: qty 15

## 2023-10-27 MED ORDER — KETOROLAC TROMETHAMINE 15 MG/ML IJ SOLN
15.0000 mg | Freq: Four times a day (QID) | INTRAMUSCULAR | Status: AC
  Administered 2023-10-27 – 2023-10-28 (×4): 15 mg via INTRAVENOUS
  Filled 2023-10-27 (×3): qty 1

## 2023-10-27 MED ORDER — CHLORHEXIDINE GLUCONATE 0.12 % MT SOLN
15.0000 mL | Freq: Once | OROMUCOSAL | Status: AC
  Administered 2023-10-27: 15 mL via OROMUCOSAL

## 2023-10-27 MED ORDER — HYDROCHLOROTHIAZIDE 12.5 MG PO TABS
12.5000 mg | ORAL_TABLET | Freq: Every day | ORAL | Status: DC
  Administered 2023-10-28: 12.5 mg via ORAL
  Filled 2023-10-27: qty 1

## 2023-10-27 MED ORDER — HYDROMORPHONE HCL 1 MG/ML IJ SOLN
INTRAMUSCULAR | Status: AC
  Filled 2023-10-27: qty 1

## 2023-10-27 MED ORDER — LACTATED RINGERS IV SOLN: INTRAVENOUS | Status: DC | PRN

## 2023-10-27 MED ORDER — LOSARTAN POTASSIUM 50 MG PO TABS
100.0000 mg | ORAL_TABLET | Freq: Every day | ORAL | Status: DC
  Administered 2023-10-28: 100 mg via ORAL
  Filled 2023-10-27: qty 2

## 2023-10-27 MED ORDER — KETOROLAC TROMETHAMINE 15 MG/ML IJ SOLN
INTRAMUSCULAR | Status: AC
  Filled 2023-10-27: qty 1

## 2023-10-27 MED ORDER — SURGIFLO WITH THROMBIN (HEMOSTATIC MATRIX KIT) OPTIME: TOPICAL | Status: DC | PRN

## 2023-10-27 MED ORDER — LACTATED RINGERS IV SOLN: INTRAVENOUS | Status: DC

## 2023-10-27 MED ORDER — MIDAZOLAM HCL 5 MG/5ML IJ SOLN
INTRAMUSCULAR | Status: DC | PRN
  Administered 2023-10-27: 2 mg via INTRAVENOUS

## 2023-10-27 MED ORDER — SUGAMMADEX SODIUM 200 MG/2ML IV SOLN
INTRAVENOUS | Status: DC | PRN
  Administered 2023-10-27: 200 mg via INTRAVENOUS

## 2023-10-27 MED ORDER — OXYCODONE HCL 5 MG PO TABS
ORAL_TABLET | ORAL | Status: AC
  Filled 2023-10-27: qty 1

## 2023-10-27 MED ORDER — DOCUSATE SODIUM 100 MG PO CAPS
100.0000 mg | ORAL_CAPSULE | Freq: Two times a day (BID) | ORAL | Status: DC
  Administered 2023-10-27 – 2023-10-28 (×2): 100 mg via ORAL
  Filled 2023-10-27 (×2): qty 1

## 2023-10-27 MED ORDER — METOCLOPRAMIDE HCL 5 MG/ML IJ SOLN: 5.0000 mg | Freq: Three times a day (TID) | INTRAMUSCULAR | Status: DC | PRN

## 2023-10-27 MED ORDER — ONDANSETRON HCL 4 MG PO TABS: 4.0000 mg | ORAL_TABLET | Freq: Four times a day (QID) | ORAL | Status: DC | PRN

## 2023-10-27 MED ORDER — CEFAZOLIN SODIUM-DEXTROSE 2-4 GM/100ML-% IV SOLN
2.0000 g | INTRAVENOUS | Status: AC
  Administered 2023-10-27: 2 g via INTRAVENOUS

## 2023-10-27 MED ORDER — ROCURONIUM BROMIDE 100 MG/10ML IV SOLN
INTRAVENOUS | Status: DC | PRN
  Administered 2023-10-27: 60 mg via INTRAVENOUS
  Administered 2023-10-27: 20 mg via INTRAVENOUS

## 2023-10-27 MED ORDER — LIDOCAINE HCL (CARDIAC) PF 100 MG/5ML IV SOSY
PREFILLED_SYRINGE | INTRAVENOUS | Status: DC | PRN
  Administered 2023-10-27: 60 mg via INTRAVENOUS

## 2023-10-27 MED ORDER — ENOXAPARIN SODIUM 40 MG/0.4ML IJ SOSY
40.0000 mg | PREFILLED_SYRINGE | INTRAMUSCULAR | Status: DC
  Administered 2023-10-28: 40 mg via SUBCUTANEOUS
  Filled 2023-10-27: qty 0.4

## 2023-10-27 MED ORDER — SODIUM CHLORIDE 0.9 % IV SOLN
INTRAVENOUS | Status: DC | PRN
  Administered 2023-10-27: 30 mL

## 2023-10-27 MED ORDER — KETAMINE HCL 10 MG/ML IJ SOLN
INTRAMUSCULAR | Status: DC | PRN
  Administered 2023-10-27: 20 mg via INTRAVENOUS

## 2023-10-27 MED ORDER — HYDROCODONE-ACETAMINOPHEN 5-325 MG PO TABS
1.0000 | ORAL_TABLET | ORAL | Status: DC | PRN
  Administered 2023-10-27 – 2023-10-28 (×2): 2 via ORAL
  Filled 2023-10-27 (×2): qty 2

## 2023-10-27 MED ORDER — CEFAZOLIN SODIUM-DEXTROSE 2-4 GM/100ML-% IV SOLN
INTRAVENOUS | Status: AC
  Filled 2023-10-27: qty 100

## 2023-10-27 MED ORDER — FENTANYL CITRATE (PF) 100 MCG/2ML IJ SOLN
INTRAMUSCULAR | Status: AC
  Filled 2023-10-27: qty 2

## 2023-10-27 MED ORDER — SURGIRINSE WOUND IRRIGATION SYSTEM - OPTIME
TOPICAL | Status: DC | PRN
  Administered 2023-10-27: 900 mL via TOPICAL

## 2023-10-27 MED ORDER — LOSARTAN POTASSIUM-HCTZ 100-12.5 MG PO TABS: 1.0000 | ORAL_TABLET | Freq: Every morning | ORAL | Status: DC

## 2023-10-27 MED ORDER — ONDANSETRON HCL 4 MG/2ML IJ SOLN
INTRAMUSCULAR | Status: DC | PRN
  Administered 2023-10-27: 4 mg via INTRAVENOUS

## 2023-10-27 MED ORDER — DEXAMETHASONE SODIUM PHOSPHATE 10 MG/ML IJ SOLN: 8.0000 mg | Freq: Once | INTRAMUSCULAR | Status: DC

## 2023-10-27 MED ORDER — PANTOPRAZOLE SODIUM 40 MG PO TBEC
40.0000 mg | DELAYED_RELEASE_TABLET | Freq: Every day | ORAL | Status: DC
  Administered 2023-10-28: 40 mg via ORAL
  Filled 2023-10-27: qty 1

## 2023-10-27 MED ORDER — ACETAMINOPHEN 500 MG PO TABS
1000.0000 mg | ORAL_TABLET | Freq: Three times a day (TID) | ORAL | Status: DC
  Filled 2023-10-27 (×2): qty 2

## 2023-10-27 MED ORDER — KETAMINE HCL 50 MG/5ML IJ SOSY
PREFILLED_SYRINGE | INTRAMUSCULAR | Status: AC
  Filled 2023-10-27: qty 5

## 2023-10-27 MED ORDER — CEFAZOLIN SODIUM-DEXTROSE 2-4 GM/100ML-% IV SOLN
2.0000 g | Freq: Four times a day (QID) | INTRAVENOUS | Status: AC
  Administered 2023-10-27 – 2023-10-28 (×2): 2 g via INTRAVENOUS
  Filled 2023-10-27 (×2): qty 100

## 2023-10-27 MED ORDER — ROCURONIUM BROMIDE 10 MG/ML (PF) SYRINGE
PREFILLED_SYRINGE | INTRAVENOUS | Status: AC
  Filled 2023-10-27: qty 10

## 2023-10-27 MED ORDER — DEXAMETHASONE SODIUM PHOSPHATE 10 MG/ML IJ SOLN
INTRAMUSCULAR | Status: AC
  Filled 2023-10-27: qty 1

## 2023-10-27 MED ORDER — PROPOFOL 500 MG/50ML IV EMUL
INTRAVENOUS | Status: DC | PRN
  Administered 2023-10-27: 100 ug/kg/min via INTRAVENOUS

## 2023-10-27 MED ORDER — LIDOCAINE HCL (PF) 2 % IJ SOLN
INTRAMUSCULAR | Status: AC
  Filled 2023-10-27: qty 5

## 2023-10-27 MED ORDER — AMLODIPINE BESYLATE 10 MG PO TABS
5.0000 mg | ORAL_TABLET | Freq: Every morning | ORAL | Status: DC
  Administered 2023-10-28: 5 mg via ORAL
  Filled 2023-10-27: qty 1

## 2023-10-27 MED ORDER — METOCLOPRAMIDE HCL 10 MG PO TABS: 5.0000 mg | ORAL_TABLET | Freq: Three times a day (TID) | ORAL | Status: DC | PRN

## 2023-10-27 MED ORDER — FENTANYL CITRATE (PF) 100 MCG/2ML IJ SOLN
25.0000 ug | INTRAMUSCULAR | Status: DC | PRN
  Administered 2023-10-27: 25 ug via INTRAVENOUS

## 2023-10-27 MED ORDER — 0.9 % SODIUM CHLORIDE (POUR BTL) OPTIME
TOPICAL | Status: DC | PRN
  Administered 2023-10-27: 500 mL

## 2023-10-27 MED ORDER — FENTANYL CITRATE (PF) 100 MCG/2ML IJ SOLN
INTRAMUSCULAR | Status: DC | PRN
  Administered 2023-10-27: 50 ug via INTRAVENOUS

## 2023-10-27 MED ORDER — MENTHOL 3 MG MT LOZG: 1.0000 | LOZENGE | OROMUCOSAL | Status: DC | PRN

## 2023-10-27 MED ORDER — ACETAMINOPHEN 10 MG/ML IV SOLN
INTRAVENOUS | Status: DC | PRN
Start: 2023-10-27 — End: 2023-10-27
  Administered 2023-10-27: 1000 mg via INTRAVENOUS

## 2023-10-27 MED ORDER — PROPOFOL 10 MG/ML IV BOLUS
INTRAVENOUS | Status: DC | PRN
  Administered 2023-10-27: 100 mg via INTRAVENOUS

## 2023-10-27 MED ORDER — ACETAMINOPHEN 325 MG PO TABS: 325.0000 mg | ORAL_TABLET | Freq: Four times a day (QID) | ORAL | Status: DC | PRN

## 2023-10-27 MED ORDER — MIDAZOLAM HCL 2 MG/2ML IJ SOLN
INTRAMUSCULAR | Status: AC
  Filled 2023-10-27: qty 2

## 2023-10-27 MED ORDER — TRANEXAMIC ACID-NACL 1000-0.7 MG/100ML-% IV SOLN
1000.0000 mg | INTRAVENOUS | Status: AC
  Administered 2023-10-27 (×2): 1000 mg via INTRAVENOUS

## 2023-10-27 MED ORDER — ORAL CARE MOUTH RINSE: 15.0000 mL | Freq: Once | OROMUCOSAL | Status: AC

## 2023-10-27 MED ORDER — SODIUM CHLORIDE 0.9 % IV SOLN: INTRAVENOUS | Status: DC

## 2023-10-27 MED ORDER — ONDANSETRON HCL 4 MG/2ML IJ SOLN
INTRAMUSCULAR | Status: AC
  Filled 2023-10-27: qty 2

## 2023-10-27 MED ORDER — SODIUM CHLORIDE (PF) 0.9 % IJ SOLN
INTRAMUSCULAR | Status: AC
Start: 2023-10-27 — End: 2023-10-27
  Filled 2023-10-27: qty 10

## 2023-10-27 MED ORDER — ACETAMINOPHEN 10 MG/ML IV SOLN
INTRAVENOUS | Status: AC
  Filled 2023-10-27: qty 100

## 2023-10-27 MED ORDER — OXYCODONE HCL 5 MG PO TABS
5.0000 mg | ORAL_TABLET | Freq: Once | ORAL | Status: AC | PRN
  Administered 2023-10-27: 5 mg via ORAL

## 2023-10-27 MED ORDER — HYDROMORPHONE HCL 1 MG/ML IJ SOLN
INTRAMUSCULAR | Status: DC | PRN
  Administered 2023-10-27 (×2): .5 mg via INTRAVENOUS

## 2023-10-27 SURGICAL SUPPLY — 58 items
BLADE CLIPPER SURG (BLADE) IMPLANT
BLADE SAGITTAL AGGR TOOTH XLG (BLADE) ×1 IMPLANT
BNDG COHESIVE 6X5 TAN ST LF (GAUZE/BANDAGES/DRESSINGS) ×2 IMPLANT
BRUSH SCRUB EZ PLAIN DRY (MISCELLANEOUS) ×1 IMPLANT
CHLORAPREP W/TINT 26 (MISCELLANEOUS) ×1 IMPLANT
DERMABOND ADVANCED .7 DNX12 (GAUZE/BANDAGES/DRESSINGS) ×1 IMPLANT
DRAPE C-ARM XRAY 36X54 (DRAPES) ×1 IMPLANT
DRAPE SHEET LG 3/4 BI-LAMINATE (DRAPES) ×2 IMPLANT
DRAPE TABLE BACK 80X90 (DRAPES) ×1 IMPLANT
DRSG MEPILEX SACRM 8.7X9.8 (GAUZE/BANDAGES/DRESSINGS) ×1 IMPLANT
DRSG OPSITE POSTOP 4X8 (GAUZE/BANDAGES/DRESSINGS) ×1 IMPLANT
ELECTRODE BLDE 4.0 EZ CLN MEGD (MISCELLANEOUS) ×1 IMPLANT
ELECTRODE REM PT RTRN 9FT ADLT (ELECTROSURGICAL) ×1 IMPLANT
GLOVE BIO SURGEON STRL SZ8 (GLOVE) ×1 IMPLANT
GLOVE BIOGEL PI IND STRL 8 (GLOVE) ×1 IMPLANT
GLOVE PI ORTHO PRO STRL 7.5 (GLOVE) ×2 IMPLANT
GLOVE PI ORTHO PRO STRL SZ8 (GLOVE) ×2 IMPLANT
GLOVE SURG SYN 7.5 PF PI (GLOVE) ×1 IMPLANT
GOWN SRG XL LVL 3 NONREINFORCE (GOWNS) ×1 IMPLANT
GOWN STRL REUS W/ TWL LRG LVL3 (GOWN DISPOSABLE) ×1 IMPLANT
GOWN STRL REUS W/ TWL XL LVL3 (GOWN DISPOSABLE) ×1 IMPLANT
HANDLE YANKAUER SUCT OPEN TIP (MISCELLANEOUS) IMPLANT
HEAD CERAMIC FEMORAL 36MM (Head) IMPLANT
HOOD PEEL AWAY T7 (MISCELLANEOUS) ×2 IMPLANT
INSERT 0 DEGREE 36 (Miscellaneous) IMPLANT
IV NS 100ML SINGLE PACK (IV SOLUTION) ×1 IMPLANT
KIT PATIENT CARE HANA TABLE (KITS) ×1 IMPLANT
KIT TURNOVER CYSTO (KITS) ×1 IMPLANT
LIGHT WAVEGUIDE WIDE FLAT (MISCELLANEOUS) ×1 IMPLANT
MANIFOLD NEPTUNE II (INSTRUMENTS) ×1 IMPLANT
MARKER SKIN DUAL TIP RULER LAB (MISCELLANEOUS) ×1 IMPLANT
MAT ABSORB FLUID 56X50 GRAY (MISCELLANEOUS) ×1 IMPLANT
NDL SPNL 20GX3.5 QUINCKE YW (NEEDLE) ×1 IMPLANT
NEEDLE SPNL 20GX3.5 QUINCKE YW (NEEDLE) ×1 IMPLANT
NS IRRIG 500ML POUR BTL (IV SOLUTION) ×1 IMPLANT
PACK HIP COMPR (MISCELLANEOUS) ×1 IMPLANT
PAD ARMBOARD POSITIONER FOAM (MISCELLANEOUS) ×1 IMPLANT
PENCIL SMOKE EVACUATOR (MISCELLANEOUS) ×1 IMPLANT
SCREW HEX LP 6.5X15 (Screw) IMPLANT
SCREW HEX LP 6.5X30 (Screw) IMPLANT
SHELL ACETAB TRIDENT 48 (Shell) IMPLANT
SLEEVE SCD COMPRESS KNEE MED (STOCKING) ×1 IMPLANT
SOLUTION IRRIG SURGIPHOR (IV SOLUTION) ×1 IMPLANT
STEM STD OFFSET SZ 1 30.5 (Stem) IMPLANT
SURGIFLO W/THROMBIN 8M KIT (HEMOSTASIS) IMPLANT
SUT BONE WAX W31G (SUTURE) ×1 IMPLANT
SUT ETHIBOND 2 V 37 (SUTURE) ×1 IMPLANT
SUT SILK 0 30XBRD TIE 6 (SUTURE) ×1 IMPLANT
SUT STRATAFIX 14 PDO 36 VLT (SUTURE) ×1 IMPLANT
SUT VIC AB 0 CT1 36 (SUTURE) ×1 IMPLANT
SUT VIC AB 2-0 CT2 27 (SUTURE) ×1 IMPLANT
SUTURE STRATA SPIR 4-0 18 (SUTURE) ×1 IMPLANT
SYR 20ML LL LF (SYRINGE) ×2 IMPLANT
TAPE MICROFOAM 4IN (TAPE) IMPLANT
TOWEL OR 17X26 4PK STRL BLUE (TOWEL DISPOSABLE) IMPLANT
TRAP FLUID SMOKE EVACUATOR (MISCELLANEOUS) ×1 IMPLANT
WAND WEREWOLF FASTSEAL 6.0 (MISCELLANEOUS) ×1 IMPLANT
WATER STERILE IRR 1000ML POUR (IV SOLUTION) ×1 IMPLANT

## 2023-10-27 NOTE — Anesthesia Procedure Notes (Signed)
 Procedure Name: Intubation Date/Time: 10/27/2023 1:01 PM  Performed by: Trudy Rankin LABOR, CRNAPre-anesthesia Checklist: Patient identified, Emergency Drugs available, Suction available and Patient being monitored Patient Re-evaluated:Patient Re-evaluated prior to induction Oxygen Delivery Method: Circle system utilized Preoxygenation: Pre-oxygenation with 100% oxygen Induction Type: IV induction Ventilation: Mask ventilation without difficulty Laryngoscope Size: McGrath and 3 Grade View: Grade I Tube type: Oral Tube size: 7.0 mm Number of attempts: 1 Airway Equipment and Method: Stylet and Oral airway Placement Confirmation: ETT inserted through vocal cords under direct vision, positive ETCO2 and breath sounds checked- equal and bilateral Secured at: 22 cm Tube secured with: Tape Dental Injury: Teeth and Oropharynx as per pre-operative assessment

## 2023-10-27 NOTE — Plan of Care (Signed)
   Problem: Activity: Goal: Risk for activity intolerance will decrease Outcome: Progressing   Problem: Nutrition: Goal: Adequate nutrition will be maintained Outcome: Progressing   Problem: Safety: Goal: Ability to remain free from injury will improve Outcome: Progressing

## 2023-10-27 NOTE — Evaluation (Signed)
 Physical Therapy Evaluation Patient Details Name: Heidi Freeman MRN: [id number] DOB: 1965/05/15 Today's Date: 10/27/2023  History of Present Illness  Pt is a 58 yo F diagnosed with left hip osteoarthritis and is s/p L THA.  Per Op Note pt also diagnosed with left greater trochanter partial avulsion injury. PMH includes HTN and hernia repair.  Clinical Impression  Pt was pleasant and motivated to participate during the session and put forth good effort throughout. Pt required only minimal assistance with bed mobility tasks and cues for sequencing.  Upon sitting pt did c/o dizziness with orthostatic BP negative and with dizziness resolving.  Pt was able to stand and ambulate 15 feet with only CGA and general cues for sequencing and demonstrated no overt LOB with any standing activity.  Pt reported no adverse symptoms during the session other than dizziness that resolved quickly and expected post op L hip pain with SpO2 and HR WNL throughout on room air.  Pt will benefit from continued PT services upon discharge to safely address deficits listed in patient problem list for decreased caregiver assistance and eventual return to PLOF.           If plan is discharge home, recommend the following: A little help with walking and/or transfers;A little help with bathing/dressing/bathroom;Assistance with cooking/housework;Help with stairs or ramp for entrance;Assist for transportation   Can travel by private vehicle        Equipment Recommendations BSC/3in1  Recommendations for Other Services       Functional Status Assessment Patient has had a recent decline in their functional status and demonstrates the ability to make significant improvements in function in a reasonable and predictable amount of time.     Precautions / Restrictions Precautions Precautions: Anterior Hip Precaution Booklet Issued: Yes (comment) Restrictions Weight Bearing Restrictions Per Provider Order: Yes LLE Weight  Bearing Per Provider Order: Weight bearing as tolerated Other Position/Activity Restrictions: Avoid LLE hip abduction excercises      Mobility  Bed Mobility Overal bed mobility: Needs Assistance Bed Mobility: Rolling, Sidelying to Sit, Sit to Supine Rolling: Supervision Sidelying to sit: Min assist   Sit to supine: Min assist   General bed mobility comments: Log roll training provided to minimize L hip abd and for decreased caregiver assistance with bed mobility tasks with pt needing just min A for BLE and trunk control    Transfers Overall transfer level: Needs assistance Equipment used: Rolling walker (2 wheels) Transfers: Sit to/from Stand Sit to Stand: Contact guard assist           General transfer comment: Min verbal cues for sequencing with good control and stability throughout    Ambulation/Gait Ambulation/Gait assistance: Contact guard assist Gait Distance (Feet): 15 Feet Assistive device: Rolling walker (2 wheels) Gait Pattern/deviations: Step-to pattern, Antalgic, Decreased step length - right, Decreased stance time - left Gait velocity: decreased     General Gait Details: Pt mildly antalgic on the LLE but steady with no overt LOB  Stairs            Wheelchair Mobility     Tilt Bed    Modified Rankin (Stroke Patients Only)       Balance Overall balance assessment: Needs assistance Sitting-balance support: Single extremity supported, Feet supported Sitting balance-Leahy Scale: Good     Standing balance support: Bilateral upper extremity supported, During functional activity Standing balance-Leahy Scale: Good  Pertinent Vitals/Pain Pain Assessment Pain Assessment: 0-10 Pain Score: 8  Pain Location: L hip Pain Descriptors / Indicators: Sore Pain Intervention(s): Patient requesting pain meds-RN notified, RN gave pain meds during session, Ice applied, Monitored during session    Home Living  Family/patient expects to be discharged to:: Private residence Living Arrangements: Other relatives Available Help at Discharge: Family;Available 24 hours/day Type of Home: House Home Access: Stairs to enter Entrance Stairs-Rails: Right Entrance Stairs-Number of Steps: 3   Home Layout: One level Home Equipment: Agricultural consultant (2 wheels);Cane - single point;Shower seat - built in Additional Comments: Pt will discharge to brother's house where brother and sister-in-law will be able to provide 24/7 assist as needed    Prior Function Prior Level of Function : Independent/Modified Independent             Mobility Comments: Ind amb without an AD in the house and with a SPC for L hip pain control community distances, no fall history ADLs Comments: Ind with ADLs     Extremity/Trunk Assessment   Upper Extremity Assessment Upper Extremity Assessment: Overall WFL for tasks assessed    Lower Extremity Assessment Lower Extremity Assessment: LLE deficits/detail LLE Deficits / Details: BLE ankle AROM, strength, and sensation to light touch grossly WNL LLE: Unable to fully assess due to pain LLE Sensation: WNL LLE Coordination: WNL       Communication   Communication Communication: No apparent difficulties    Cognition Arousal: Alert Behavior During Therapy: WFL for tasks assessed/performed   PT - Cognitive impairments: No apparent impairments                         Following commands: Intact       Cueing Cueing Techniques: Verbal cues, Visual cues     General Comments      Exercises Other Exercises Other Exercises: Pt education on WB status and to avoid L hip abd exercises until cleared by surgical team   Assessment/Plan    PT Assessment Patient needs continued PT services  PT Problem List Decreased strength;Decreased activity tolerance;Decreased balance;Decreased mobility;Decreased knowledge of use of DME;Decreased knowledge of precautions;Pain        PT Treatment Interventions DME instruction;Gait training;Stair training;Functional mobility training;Therapeutic activities;Therapeutic exercise;Balance training;Patient/family education    PT Goals (Current goals can be found in the Care Plan section)  Acute Rehab PT Goals Patient Stated Goal: To be more active PT Goal Formulation: With patient Time For Goal Achievement: 11/09/23 Potential to Achieve Goals: Good    Frequency BID     Co-evaluation               AM-PAC PT 6 Clicks Mobility  Outcome Measure Help needed turning from your back to your side while in a flat bed without using bedrails?: A Little Help needed moving from lying on your back to sitting on the side of a flat bed without using bedrails?: A Little Help needed moving to and from a bed to a chair (including a wheelchair)?: A Little Help needed standing up from a chair using your arms (e.g., wheelchair or bedside chair)?: A Little Help needed to walk in hospital room?: A Little Help needed climbing 3-5 steps with a railing? : A Lot 6 Click Score: 17    End of Session Equipment Utilized During Treatment: Gait belt Activity Tolerance: Patient tolerated treatment well Patient left: in bed;with call bell/phone within reach;with nursing/sitter in room;with SCD's reapplied Nurse Communication: Mobility status;Weight bearing  status;Precautions (No L hip abd exercises) PT Visit Diagnosis: Other abnormalities of gait and mobility (R26.89);Muscle weakness (generalized) (M62.81);Pain Pain - Right/Left: Left Pain - part of body: Hip    Time: 8372-8341 PT Time Calculation (min) (ACUTE ONLY): 31 min   Charges:   PT Evaluation $PT Eval Moderate Complexity: 1 Mod PT Treatments $Therapeutic Activity: 8-22 mins PT General Charges $$ ACUTE PT VISIT: 1 Visit       D. Glendia Bertin PT, DPT 10/27/23, 5:22 PM

## 2023-10-27 NOTE — H&P (Signed)
 History of Present Illness: Heidi Freeman is an 58 y.o. female presents for history and physical for left direct anterior total hip arthroplasty with Dr. Lorelle on 10/27/2023. Patient has advanced left hip osteoarthritis that has been causing her symptoms over the last few years. She has pain in her thigh, groin that is sharp and worse with standing and walking. Despite Tylenol  injections severe debilitating pain. She only received 2 weeks worth of relief with a cortisone injection into the hip joint on 07/23/2023. Pain is interfering with her quality life and activities a living. The patient denies fevers, chills, numbness, tingling, shortness of breath, chest pain, recent illness, or any trauma.  Patient is a non-smoker, nondiabetic with an A1c of 6.0 and a BMI of 25  Past Medical History: Past Medical History:  Diagnosis Date  Hypertension   Past Surgical History: Past Surgical History:  Procedure Laterality Date  CESAREAN SECTION 1992  COLONOSCOPY 01/20/2017  Normal Colon/Repeat 103yrs/TKT  EGD @ [facility] 04/01/2022  Gastritis/Esophagus dilated/No repeat/TKT   Past Family History: Family History  Problem Relation Age of Onset  High blood pressure (Hypertension) Mother  Diabetes Mother  Coronary Artery Disease (Blocked arteries around heart) Mother  High blood pressure (Hypertension) Father  Prostate cancer Maternal Grandmother  Breast cancer Maternal Grandfather  Cancer Paternal Grandmother  Aneurysm Paternal Grandfather   Medications: Current Outpatient Medications  Medication Sig Dispense Refill  amLODIPine  (NORVASC ) 5 MG tablet Take 1 tablet (5 mg total) by mouth once daily 90 tablet 3  carvediloL  (COREG ) 12.5 MG tablet Take 1 tablet (12.5 mg total) by mouth 2 (two) times daily with meals 60 tablet 11  celecoxib (CELEBREX) 100 MG capsule TAKE 1 CAPSULE BY MOUTH 2 TIMES DAILY FOR 30 DAYS. 60 capsule 0  losartan -hydroCHLOROthiazide  (HYZAAR) 100-12.5 mg tablet Take 1  tablet by mouth once daily 90 tablet 3  tiZANidine (ZANAFLEX) 2 MG tablet Take 1 tablet (2 mg total) by mouth every 8 (eight) hours as needed for Muscle spasms 30 tablet 0   No current facility-administered medications for this visit.   Allergies: No Known Allergies   Visit Vitals: Vitals:  10/15/23 0821  BP: 116/78    Review of Systems:  A comprehensive 14 point ROS was performed, reviewed, and the pertinent orthopaedic findings are documented in the HPI.  Physical Exam: Body mass index is 25.86 kg/m. General:  Well developed, well nourished, no apparent distress, normal affect, normal gait with no antalgic component.   HEENT: Head normocephalic, atraumatic, PERRL.   Abdomen: Soft, non tender, non distended, Bowel sounds present.  Heart: Examination of the heart reveals regular, rate, and rhythm. There is no murmur noted on ascultation. There is a normal apical pulse.  Lungs: Lungs are clear to auscultation. There is no wheeze, rhonchi, or crackles. There is normal expansion of bilateral chest walls.   Left hip exam  SKIN: intact SWELLING: none WARMTH: no warmth TENDERNESS: none, Stinchfield Positive ROM: 0 degrees internal rotation and 30 degrees external rotation and pain with internal rotation,; Hip Flexion 95 STRENGTH: limited by pain GAIT: stiff-legged STABILITY: stable to testing CREPITUS: no LEG LENGTH DISCREPANCY: right longer by .2 cm NEUROLOGICAL EXAM: normal VASCULAR EXAM: normal LUMBAR SPINE: tenderness: no straight leg raising sign: no motor exam: normal  The contralateral hip was examined for comparison and it showed: TENDERNESS: none ROM: normal and full STRENGTH: normal STABILITY: stable to testing  Hip Imaging :  X-rays from 07/16/2023 show severe left hip degeneration with osteophyte formation sclerosis bone-on-bone  articulation, femoral head deformity with cystic changes. The right hip shows mild degenerative changes with medial inferior  joint space narrowing with preservation of superior joint space. No fractures or dislocations in the right hip or pelvis. There are degenerative changes noted to the partially visualized lumbosacral spine.  Assessment:  Left hip osteoarthritis  Plan: Heidi Freeman is a 58 year old female presents with advanced left hip osteoarthritis. Pain interferes with her quality of life and activities of daily living despite conservative treatment. X-rays show signs of advanced left hip degenerative changes. Risks, benefits, complications of a left anterior total hip arthroplasty have been discussed with the patient. Patient has agreed to the procedure.   The hospitalization and post-operative care and rehabilitation were also discussed. The use of perioperative antibiotics and DVT prophylaxis were discussed. The risk, benefits and alternatives to a surgical intervention were discussed at length with the patient. The patient was also advised of risks related to the medical comorbidities and elevated body mass index (BMI). A lengthy discussion took place to review the most common complications including but not limited to: deep vein thrombosis, pulmonary embolus, heart attack, stroke, infection, wound breakdown, heterotopic ossification, dislocation, numbness, leg length in-equality, intraoperative fracture, damage to nerves, tendon,muscles, arteries or other blood vessels, death and other possible complications from anesthesia. The patient was told that we will take steps to minimize these risks by using sterile technique, antibiotics and DVT prophylaxis when appropriate and follow the patient postoperatively in the office setting to monitor progress. The possibility of recurrent pain, no improvement in pain and actual worsening of pain were also discussed with the patient. The risk of dislocation following total hip replacement was discussed and potential precautions to prevent dislocation were reviewed.

## 2023-10-27 NOTE — Op Note (Signed)
 Patient Name: Heidi Freeman  FMW:[id number]  Pre-Operative Diagnosis: Left hip Osteoarthritis  Post-Operative Diagnosis: (same)  Procedure: Left Total Hip Arthroplasty  Components/Implants: Cup: Trident Tritanium clusterhole 48/D w/x2 screws    Liner: Neutral X3 poly 36/D  Stem: Insignia #1 Std offset  Head:Biolox ceramic 36+ 0  Date of Surgery: 10/27/2023  Surgeon: Arthea Sheer MD  Assistant: Debby Amber PA (present and scrubbed throughout the case, critical for assistance with exposure, retraction, instrumentation, and closure)   Anesthesiologist: PIscitello  Anesthesia: General  EBL: 125cc  IVF:700cc   Brief history: The patient is a 58 year old female with a history of osteoarthritis of the left hip with pain limiting their range of motion and activities of daily living, which has failed multiple attempts at conservative therapy.  The risks and benefits of total hip arthroplasty as definitive surgical treatment were discussed with the patient, who opted to proceed with the operation.  After outpatient medical clearance and optimization was completed the patient was admitted to [facility] for the procedure.  All preoperative films were reviewed and an appropriate surgical plan was made prior to surgery.   Description of procedure: The patient was brought to the operating room where laterality was confirmed by all those present to be the left side.  The patient was administered spinal anesthesia on a stretcher prior to being moved supine on the operating room table. Patient was given an intravenous dose of antibiotics for surgical prophylaxis and TXA.  All bony prominences and extremities were well padded and the patient was securely attached to the table boots, a perineal post was placed and the patient had a safety strap placed.  Surgical site was prepped with alcohol and chlorhexidine . The surgical site over the hip was and draped in typical sterile fashion  with multiple layers of adhesive and nonadhesive drapes.  The incision site was marked out with a sterile marker and care was taken to assess the position of the ASIS and ensure appropriate position for the incision.    A surgical timeout was then called with participation of all staff in the room the patient was then a confirmed again and laterality confirmed.  Incision was made over the anterior lateral aspect of the proximal thigh in line with the TFL.  Appropriate retractors were placed and all bleeding vessels were coagulated within the subcutaneous and fatty layers.  An incision was made in the TFL fascia in the interval was carefully identified.  The lateral ascending branches of the circumflex vessels were identified, cauterized and carefully dissected. The main vessels were then tied with a 0 silk hand tie.  Retractors were placed around the superior lateral and inferior medial aspects of the femoral neck and a capsulotomy was performed exposing the hip joint.  Retraction stitches were placed and the capsulotomy to assist with visualization.  Femoral neck cut was then made and the femoral head was extracted after placing the leg in traction.  Bone wax was then applied to the proximal cut surface of the femur and water cooled bipolar electrocautery was used to address any bleeding around the femoral neck cut.  Retractors were then placed around the acetabulum to fully visualize the joint space, and the remaining labral tissue was removed and pulvinar was removed.   The acetabulum was then sequentially reamed up to the appropriate size in order to get good fit and fill for the acetabular component while under fluoroscopic guidance.  Acetabular component was then placed and malleted into a secure fit  while confirming position and abduction angle and anteversion utilizing fluoroscopy.  2 screws were then placed in the acetabular cup to assist in securing the cup in place. The cup was irrigated,  a real  neutral liner was placed, impacted, and checked for stability. The femur traction was dropped and sequentially externally rotated while performing a release of the posterior and superomedial tissues off of the proximal femur to allow for mobility, care was taken to preserve the external rotators and piriformis attachments.  The remaining interval between the abductors and the capsule was dissected out and a retractor was placed over the superolateral aspect of the femur over the greater trochanter.  The leg was carefully brought down into extension and adducted to provide visualization of the proximal femur for broaching.  During elevation and external rotation of the femur a small piece of the anterior corner of the greater trochanter sustained a partial avulsion injury.  The superior and entire posterior aspect of the greater trochanter were completely intact without any further splitting or displacement of the greater trochanter.  The hip was taken through range of motion under fluoroscopic evaluation and no displaced bone fragments or fractures were noted.  This was completed again at the end of the case with no displacement of any bony fragments.  The decision was made to treat conservatively.  The femur was then sequentially broached up to an appropriate size which provided for good fill and stability to the femoral broach.  A trial neck and head were placed on the femoral broach and the leg was brought up for reduction.  The hip was reduced and manual check of stability was performed.  The hip was found to be stable in flexion internal rotation and extension external rotation.  Leg lengths were confirmed on fluoroscopy.   The hip was then dislocated the trial neck and head were removed.  The leg was then brought down into extension and adduction in the proximal femur was reexposed.  The broach trial was removed and the femur was irrigated with normal saline prior to the real femoral stem being implanted.   After the femoral stem was seated and shown to have good fit and fill the appropriate head was impacted the leg was brought up and reduced.  There was good range of motion with stability in flexion internal rotation and extension external rotation on testing.  Leg lengths were found to be appropriate on fluoroscopic evaluation at this time.  The hip was then irrigated with betdine based surgiphor solution and then saline solution.  The capsulotomy was repaired with Ethibond sutures.  A pericapsular and peritrochanteric cocktail with Exparel  and bupivacaine  was then injected as well as the subcutaneous tissues. The fascia was closed with a #1 barbed running suture.  The deep tissues were closed with Vicryl sutures the subcutaneous tissues were closed with interrupted Vicryl sutures and a running barbed 4-0 suture.  The skin was then reinforced with Dermabond and a sterile dressing was placed.   The patient was awoken from anesthesia transferred off of the operating room table onto a hospital bed where examination of leg lengths found the leg lengths to be equal with a good distal pulse.  The patient was then transferred to the PACU in stable condition.

## 2023-10-27 NOTE — Plan of Care (Signed)
  Problem: Education: Goal: Knowledge of General Education information will improve Description: Including pain rating scale, medication(s)/side effects and non-pharmacologic comfort measures Outcome: Progressing   Problem: Health Behavior/Discharge Planning: Goal: Ability to manage health-related needs will improve Outcome: Progressing   Problem: Clinical Measurements: Goal: Ability to maintain clinical measurements within normal limits will improve Outcome: Progressing Goal: Will remain free from infection Outcome: Progressing Goal: Diagnostic test results will improve Outcome: Progressing Goal: Respiratory complications will improve Outcome: Progressing Goal: Cardiovascular complication will be avoided Outcome: Progressing   Problem: Activity: Goal: Risk for activity intolerance will decrease Outcome: Progressing   Problem: Nutrition: Goal: Adequate nutrition will be maintained Outcome: Progressing   Problem: Coping: Goal: Level of anxiety will decrease Outcome: Progressing   Problem: Elimination: Goal: Will not experience complications related to bowel motility Outcome: Progressing Goal: Will not experience complications related to urinary retention Outcome: Progressing   Problem: Pain Managment: Goal: General experience of comfort will improve and/or be controlled Outcome: Progressing   Problem: Safety: Goal: Ability to remain free from injury will improve Outcome: Progressing   Problem: Skin Integrity: Goal: Risk for impaired skin integrity will decrease Outcome: Progressing   Problem: Education: Goal: Knowledge of the prescribed therapeutic regimen will improve Outcome: Progressing Goal: Understanding of discharge needs will improve Outcome: Progressing Goal: Individualized Educational Video(s) Outcome: Progressing   Problem: Activity: Goal: Ability to avoid complications of mobility impairment will improve Outcome: Progressing Goal: Ability to  tolerate increased activity will improve Outcome: Progressing   Problem: Clinical Measurements: Goal: Postoperative complications will be avoided or minimized Outcome: Progressing   Problem: Pain Management: Goal: Pain level will decrease with appropriate interventions Outcome: Progressing

## 2023-10-27 NOTE — Anesthesia Preprocedure Evaluation (Signed)
 Anesthesia Evaluation  Patient identified by MRN, date of birth, ID band Patient awake    Reviewed: Allergy & Precautions, NPO status , Patient's Chart, lab work & pertinent test results  History of Anesthesia Complications Negative for: history of anesthetic complications  Airway Mallampati: III  TM Distance: >3 FB Neck ROM: full    Dental  (+) Chipped, Poor Dentition, Missing   Pulmonary neg shortness of breath, former smoker   Pulmonary exam normal        Cardiovascular Exercise Tolerance: Good hypertension, (-) angina (-) Past MI and (-) DOE Normal cardiovascular exam     Neuro/Psych  Neuromuscular disease  negative psych ROS   GI/Hepatic negative GI ROS, Neg liver ROS,neg GERD  ,,  Endo/Other  negative endocrine ROS    Renal/GU      Musculoskeletal   Abdominal   Peds  Hematology negative hematology ROS (+)   Anesthesia Other Findings Past Medical History: No date: Arthritis     Comment:  lower back No date: Hypertension  Past Surgical History: No date: COLONOSCOPY 2001: HERNIA REPAIR 11/24/2020: NASAL TURBINATE REDUCTION; Bilateral     Comment:  Procedure: TURBINATE REDUCTION/SUBMUCOSAL RESECTION;                Surgeon: Herminio Miu, MD;  [facility];  Service: ENT;  Laterality: Bilateral; 11/24/2020: SEPTOPLASTY; N/A     Comment:  Procedure: SEPTOPLASTY;  Surgeon: Herminio Miu, MD;               Location: [facility];  Service: ENT;                Laterality: N/A; 1998: TUBAL LIGATION  BMI    Body Mass Index: 25.58 kg/m      Reproductive/Obstetrics negative OB ROS                              Anesthesia Physical Anesthesia Plan  ASA: 2  Anesthesia Plan: Spinal   Post-op Pain Management:    Induction:   PONV Risk Score and Plan:   Airway Management Planned: Natural Airway and Nasal Cannula  Additional  Equipment:   Intra-op Plan:   Post-operative Plan:   Informed Consent: I have reviewed the patients History and Physical, chart, labs and discussed the procedure including the risks, benefits and alternatives for the proposed anesthesia with the patient or authorized representative who has indicated his/her understanding and acceptance.     Dental Advisory Given  Plan Discussed with: Anesthesiologist, CRNA and Surgeon  Anesthesia Plan Comments: (Patient reports no bleeding problems and no anticoagulant use.  Plan for spinal with backup GA  Patient consented for risks of anesthesia including but not limited to:  - adverse reactions to medications - damage to eyes, teeth, lips or other oral mucosa - nerve damage due to positioning  - risk of bleeding, infection and or nerve damage from spinal that could lead to paralysis - risk of headache or failed spinal - damage to teeth, lips or other oral mucosa - sore throat or hoarseness - damage to heart, brain, nerves, lungs, other parts of body or loss of life  Patient voiced understanding and assent.)        Anesthesia Quick Evaluation

## 2023-10-27 NOTE — Interval H&P Note (Signed)
Patient history and physical updated. Consent reviewed including risks, benefits, and alternatives to surgery. Patient agrees with above plan to proceed with left anterior total hip arthroplasty  

## 2023-10-27 NOTE — Transfer of Care (Signed)
 Immediate Anesthesia Transfer of Care Note  Patient: Heidi Freeman Staff  Procedure(s) Performed: ARTHROPLASTY, HIP, TOTAL, ANTERIOR APPROACH (Left: Hip)  Patient Location: PACU  Anesthesia Type:General  Level of Consciousness: awake, drowsy, and patient cooperative  Airway & Oxygen Therapy: Patient Spontanous Breathing and Patient connected to face mask oxygen  Post-op Assessment: Report given to RN and Post -op Vital signs reviewed and stable  Post vital signs: Reviewed and stable  Last Vitals:  Vitals Value Taken Time  BP 140/85 10/27/23 15:09  Temp    Pulse 75 10/27/23 15:13  Resp 16 10/27/23 15:13  SpO2 100 % 10/27/23 15:13  Vitals shown include unfiled device data.  Last Pain:  Vitals:   10/27/23 1019  TempSrc: Temporal  PainSc: 2          Complications: There were no known notable events for this encounter.

## 2023-10-28 ENCOUNTER — Encounter: Payer: Self-pay | Admitting: Orthopedic Surgery

## 2023-10-28 DIAGNOSIS — I10 Essential (primary) hypertension: Secondary | ICD-10-CM | POA: Diagnosis not present

## 2023-10-28 DIAGNOSIS — M1612 Unilateral primary osteoarthritis, left hip: Secondary | ICD-10-CM | POA: Diagnosis not present

## 2023-10-28 DIAGNOSIS — Z79899 Other long term (current) drug therapy: Secondary | ICD-10-CM | POA: Diagnosis not present

## 2023-10-28 DIAGNOSIS — D62 Acute posthemorrhagic anemia: Secondary | ICD-10-CM | POA: Diagnosis not present

## 2023-10-28 LAB — CBC
HCT: 29.9 % — ABNORMAL LOW (ref 36.0–46.0)
Hemoglobin: 9.9 g/dL — ABNORMAL LOW (ref 12.0–15.0)
MCH: 31.4 pg (ref 26.0–34.0)
MCHC: 33.1 g/dL (ref 30.0–36.0)
MCV: 94.9 fL (ref 80.0–100.0)
Platelets: 244 K/uL (ref 150–400)
RBC: 3.15 MIL/uL — ABNORMAL LOW (ref 3.87–5.11)
RDW: 11.3 % — ABNORMAL LOW (ref 11.5–15.5)
WBC: 8.2 K/uL (ref 4.0–10.5)
nRBC: 0 % (ref 0.0–0.2)

## 2023-10-28 LAB — BASIC METABOLIC PANEL WITH GFR
Anion gap: 11 (ref 5–15)
BUN: 18 mg/dL (ref 6–20)
CO2: 27 mmol/L (ref 22–32)
Calcium: 9.1 mg/dL (ref 8.9–10.3)
Chloride: 101 mmol/L (ref 98–111)
Creatinine, Ser: 0.83 mg/dL (ref 0.44–1.00)
GFR, Estimated: >60 mL/min (ref >60)
Glucose, Bld: 127 mg/dL — ABNORMAL HIGH (ref 70–99)
Potassium: 4.6 mmol/L (ref 3.5–5.1)
Sodium: 139 mmol/L (ref 135–145)

## 2023-10-28 MED ORDER — FE FUM-VIT C-VIT B12-FA 460-60-0.01-1 MG PO CAPS: 1.0000 | ORAL_CAPSULE | Freq: Every day | ORAL | 0 refills | Status: AC

## 2023-10-28 MED ORDER — TRAMADOL HCL 50 MG PO TABS: 50.0000 mg | ORAL_TABLET | Freq: Four times a day (QID) | ORAL | 0 refills | Status: AC | PRN

## 2023-10-28 MED ORDER — ONDANSETRON HCL 4 MG PO TABS: 4.0000 mg | ORAL_TABLET | Freq: Four times a day (QID) | ORAL | 0 refills | Status: AC | PRN

## 2023-10-28 MED ORDER — ACETAMINOPHEN 500 MG PO TABS: 1000.0000 mg | ORAL_TABLET | Freq: Three times a day (TID) | ORAL | 0 refills | Status: AC

## 2023-10-28 MED ORDER — OXYCODONE HCL 5 MG PO TABS: 2.5000 mg | ORAL_TABLET | Freq: Three times a day (TID) | ORAL | 0 refills | Status: AC | PRN

## 2023-10-28 MED ORDER — FE FUM-VIT C-VIT B12-FA 460-60-0.01-1 MG PO CAPS
1.0000 | ORAL_CAPSULE | Freq: Every day | ORAL | Status: DC
  Administered 2023-10-28: 1 via ORAL
  Filled 2023-10-28: qty 1

## 2023-10-28 MED ORDER — DOCUSATE SODIUM 100 MG PO CAPS: 100.0000 mg | ORAL_CAPSULE | Freq: Two times a day (BID) | ORAL | 0 refills | Status: AC

## 2023-10-28 MED ORDER — ENOXAPARIN SODIUM 40 MG/0.4ML IJ SOSY: 40.0000 mg | PREFILLED_SYRINGE | INTRAMUSCULAR | 0 refills | Status: AC

## 2023-10-28 NOTE — Progress Notes (Signed)
 Patient is not able to walk the distance required to go the bathroom, or she is unable to safely negotiate stairs required to access the bathroom.  A 3in1 BSC will alleviate this problem.       Lollie Marrow, PA-C [facility]

## 2023-10-28 NOTE — Anesthesia Postprocedure Evaluation (Signed)
 Anesthesia Post Note  Patient: Heidi Freeman  Procedure(s) Performed: ARTHROPLASTY, HIP, TOTAL, ANTERIOR APPROACH (Left: Hip)  Patient location during evaluation: Short Stay Anesthesia Type: Spinal Level of consciousness: awake and alert and oriented Pain management: satisfactory to patient Vital Signs Assessment: post-procedure vital signs reviewed and stable Respiratory status: spontaneous breathing Cardiovascular status: stable Postop Assessment: adequate PO intake and able to ambulate Anesthetic complications: no Comments: Has been able to void.   There were no known notable events for this encounter.   Last Vitals:  Vitals:   10/28/23 0052 10/28/23 0454  BP: (!) 145/95 (!) 133/91  Pulse: 72 78  Resp: 15 16  Temp: 36.7 C 36.6 C  SpO2: 98% 99%    Last Pain:  Vitals:   10/28/23 0629  TempSrc:   PainSc: 3                  [name] Dyane

## 2023-10-28 NOTE — Evaluation (Signed)
 Occupational Therapy Evaluation Patient Details Name: Heidi Freeman MRN: [id number] DOB: [birth year] Today's Date: 10/28/2023   History of Present Illness   Pt is a 58 yo F diagnosed with left hip osteoarthritis and is s/p L THA.  Per Op Note pt also diagnosed with left greater trochanter partial avulsion injury. PMH includes HTN and hernia repair.     Clinical Impressions Heidi Freeman was seen for OT evaluation this date, POD#1 from above surgery. Pt was independent in all ADLs prior to surgery, however occasionally using SPC for mobility due to L hip pain. Pt is eager to return to PLOF with less pain and improved safety and independence. Pt currently requires minimal assist for LB dressing and bathing while in seated position due to pain and limited AROM of L hip. Pt instructed in self care skills, falls prevention strategies, home/routines modifications, DME/AE for LB bathing and dressing tasks, and compression stocking mgt strategies. Pt would benefit from additional instruction in self care skills and techniques to help maintain precautions with or without assistive devices to support recall and carryover prior to discharge. Do not anticipate the need for follow up therapy upon acute hospital DC.      If plan is discharge home, recommend the following:   A little help with walking and/or transfers;Help with stairs or ramp for entrance;Assist for transportation;Assistance with cooking/housework     Functional Status Assessment   Patient has had a recent decline in their functional status and demonstrates the ability to make significant improvements in function in a reasonable and predictable amount of time.     Equipment Recommendations   None recommended by OT     Recommendations for Other Services         Precautions/Restrictions   Precautions Precautions: Anterior Hip Precaution Booklet Issued: Yes (comment) Restrictions Weight Bearing Restrictions Per Provider  Order: Yes LLE Weight Bearing Per Provider Order: Weight bearing as tolerated Other Position/Activity Restrictions: Avoid LLE hip abduction excercises     Mobility Bed Mobility Overal bed mobility: Modified Independent             General bed mobility comments: NT up in recliner at start/end of session.    Transfers Overall transfer level: Needs assistance Equipment used: Rolling walker (2 wheels) Transfers: Sit to/from Stand Sit to Stand: Supervision           General transfer comment: Min verbal cues for sequencing with good control and stability throughout      Balance Overall balance assessment: Needs assistance Sitting-balance support: Feet supported, No upper extremity supported Sitting balance-Leahy Scale: Good     Standing balance support: No upper extremity supported, During functional activity Standing balance-Leahy Scale: Good Standing balance comment: steady static standig at sink performing oral care                           ADL either performed or assessed with clinical judgement   ADL Overall ADL's : Needs assistance/impaired                                       General ADL Comments: SUPERVISION for STS t/fs, standing grooming at sink, and UB dressing. MIN A for LB dressing to pull pants over feet.     Vision Baseline Vision/History: 0 No visual deficits Patient Visual Report: No change from baseline  Perception         Praxis         Pertinent Vitals/Pain Pain Assessment Pain Assessment: No/denies pain Pain Intervention(s): Monitored during session     Extremity/Trunk Assessment Upper Extremity Assessment Upper Extremity Assessment: Generalized weakness   Lower Extremity Assessment Lower Extremity Assessment: Generalized weakness   Cervical / Trunk Assessment Cervical / Trunk Assessment: Normal   Communication Communication Communication: No apparent difficulties   Cognition Arousal:  Alert Behavior During Therapy: WFL for tasks assessed/performed                                 Following commands: Intact       Cueing  General Comments   Cueing Techniques: Verbal cues      Exercises Other Exercises Other Exercises: Pt educated on role of OT in acute setting, safety, safe use of AE/DME for ADL management, falls prevention strategies, and DC recs.   Shoulder Instructions      Home Living Family/patient expects to be discharged to:: Private residence Living Arrangements: Other relatives Available Help at Discharge: Family;Available 24 hours/day Type of Home: House Home Access: Stairs to enter [employer] of Steps: 3 Entrance Stairs-Rails: Right Home Layout: One level     Bathroom Shower/Tub: Producer, television/film/video: Standard     Home Equipment: Agricultural consultant (2 wheels);Cane - single point;Shower seat - built in          Prior Functioning/Environment Prior Level of Function : Independent/Modified Independent             Mobility Comments: Ind amb without an AD in the house and with a SPC for L hip pain control community distances, no fall history ADLs Comments: Ind with ADLs    OT Problem List: Decreased strength;Decreased coordination;Pain;Decreased range of motion;Decreased safety awareness;Impaired balance (sitting and/or standing);Decreased knowledge of use of DME or AE   OT Treatment/Interventions: Self-care/ADL training;Therapeutic exercise;Therapeutic activities;DME and/or AE instruction;Balance training;Patient/family education      OT Goals(Current goals can be found in the care plan section)   Acute Rehab OT Goals Patient Stated Goal: to get stronger OT Goal Formulation: With patient Time For Goal Achievement: 11/11/23 Potential to Achieve Goals: Good ADL Goals Pt Will Perform Grooming: standing;with modified independence;with adaptive equipment Pt Will Perform Lower Body Dressing: sit  to/from stand;with modified independence Pt Will Transfer to Toilet: regular height toilet;with modified independence;ambulating Pt Will Perform Toileting - Clothing Manipulation and hygiene: with modified independence;sit to/from stand   OT Frequency:  Min 1X/week    Co-evaluation              AM-PAC OT 6 Clicks Daily Activity     Outcome Measure Help from another person eating meals?: None Help from another person taking care of personal grooming?: None Help from another person toileting, which includes using toliet, bedpan, or urinal?: A Little Help from another person bathing (including washing, rinsing, drying)?: A Little Help from another person to put on and taking off regular upper body clothing?: None Help from another person to put on and taking off regular lower body clothing?: A Little 6 Click Score: 21   End of Session Equipment Utilized During Treatment: Gait belt;Rolling walker (2 wheels) Nurse Communication: Mobility status;Other (comment) (pt reporting 8/10 dizziness, BP noted to be 167/93)  Activity Tolerance: Patient tolerated treatment well Patient left: in chair;with call bell/phone within reach;with chair alarm set  OT  Visit Diagnosis: Other abnormalities of gait and mobility (R26.89);Pain Pain - Right/Left: Left Pain - part of body: Hip                Time: 9143-9071 OT Time Calculation (min): 32 min Charges:  OT General Charges $OT Visit: 1 Visit OT Evaluation $OT Eval Moderate Complexity: 1 Mod OT Treatments $Self Care/Home Management : 8-22 mins  Jhonny Pelton, M.S., OTR/L 10/28/23, 1:08 PM

## 2023-10-28 NOTE — Anesthesia Postprocedure Evaluation (Signed)
 Anesthesia Post Note  Patient: Heidi Freeman  Procedure(s) Performed: ARTHROPLASTY, HIP, TOTAL, ANTERIOR APPROACH (Left: Hip)  Patient location during evaluation: PACU Anesthesia Type: General Level of consciousness: awake and alert Pain management: pain level controlled Vital Signs Assessment: post-procedure vital signs reviewed and stable Respiratory status: spontaneous breathing, nonlabored ventilation and respiratory function stable Cardiovascular status: blood pressure returned to baseline and stable Postop Assessment: no apparent nausea or vomiting Anesthetic complications: no   There were no known notable events for this encounter.   Last Vitals:  Vitals:   10/28/23 0454 10/28/23 0932  BP: (!) 133/91 (!) 159/90  Pulse: 78 (!) 58  Resp: 16 16  Temp: 36.6 C 37.1 C  SpO2: 99% 95%    Last Pain:  Vitals:   10/28/23 0932  TempSrc: Oral  PainSc: 0-No pain                 Fairy POUR Sabreen Kitchen

## 2023-10-28 NOTE — Discharge Summary (Signed)
 Physician Discharge Summary  Patient ID: Heidi Freeman MRN: [id number] DOB/AGE: 1965-04-23 58 y.o.  Admit date: 10/27/2023 Discharge date: 10/28/2023  Admission Diagnoses:  Primary osteoarthritis of left hip [M16.12] S/P total left hip arthroplasty [S03.357]   Discharge Diagnoses: Patient Active Problem List   Diagnosis Date Noted   S/P total left hip arthroplasty 10/27/2023   Thoracic myofascial strain 11/20/2022    Past Medical History:  Diagnosis Date   Arthritis    lower back   Hypertension      Transfusion: none   Consultants (if any):   Discharged Condition: Improved  Hospital Course: Heidi Freeman is an 58 y.o. female who was admitted 10/27/2023 with a diagnosis of S/P total left hip arthroplasty and went to the operating room on 10/27/2023 and underwent the above named procedures.    Surgeries: Procedure(s): ARTHROPLASTY, HIP, TOTAL, ANTERIOR APPROACH on 10/27/2023 Patient tolerated the surgery well. Taken to PACU where she was stabilized and then transferred to the orthopedic floor.  Started on Lovenox  40mg  q 24 hrs. TEDs and SCDs applied bilaterally. Heels elevated on bed. No evidence of DVT. Negative Homan. Physical therapy started on day #1 for gait training and transfer. OT started day #1 for ADL and assisted devices.  Patient's IV was d/c on day #1. Patient was able to safely and independently complete all PT goals. PT recommending discharge to home.    On post op day #1 patient was stable and ready for discharge to home with HHPT.  Implants: Cup: Trident Tritanium clusterhole 48/D w/x2 screws    Liner: Neutral X3 poly 36/D  Stem: Insignia #1 Std offset  Head:Biolox ceramic 36+ 0    She was given perioperative antibiotics:  Anti-infectives (From admission, onward)    Start     Dose/Rate Route Frequency Ordered Stop   10/27/23 1900  ceFAZolin  (ANCEF ) IVPB 2g/100 mL premix        2 g 200 mL/hr over 30 Minutes Intravenous Every 6 hours 10/27/23 1740  10/28/23 0201   10/27/23 0600  ceFAZolin  (ANCEF ) IVPB 2g/100 mL premix        2 g 200 mL/hr over 30 Minutes Intravenous On call to O.R. 10/27/23 9795 10/27/23 1302     .  She was given sequential compression devices, early ambulation, and lovenox  for DVT prophylaxis.  She benefited maximally from the hospital stay and there were no complications.    Recent vital signs:  Vitals:   10/28/23 0052 10/28/23 0454  BP: (!) 145/95 (!) 133/91  Pulse: 72 78  Resp: 15 16  Temp: 98 F (36.7 C) 97.9 F (36.6 C)  SpO2: 98% 99%    Recent laboratory studies:  Lab Results  Component Value Date   HGB 9.9 (L) 10/28/2023   HGB 12.2 12/14/2014   Lab Results  Component Value Date   WBC 8.2 10/28/2023   PLT 244 10/28/2023   No results found for: INR Lab Results  Component Value Date   NA 139 10/28/2023   K 4.6 10/28/2023   CL 101 10/28/2023   CO2 27 10/28/2023   BUN 18 10/28/2023   CREATININE 0.83 10/28/2023   GLUCOSE 127 (H) 10/28/2023    Discharge Medications:   Allergies as of 10/28/2023   No Known Allergies      Medication List     TAKE these medications    acetaminophen  500 MG tablet Commonly known as: TYLENOL  Take 2 tablets (1,000 mg total) by mouth every 8 (eight) hours.   amLODipine   5 MG tablet Commonly known as: NORVASC  Take 1 tablet (5 mg total) by mouth 2 (two) times daily. What changed: when to take this   carvedilol  12.5 MG tablet Commonly known as: COREG  Take 12.5 mg by mouth 2 (two) times daily.   celecoxib 100 MG capsule Commonly known as: CELEBREX Take 100 mg by mouth 2 (two) times daily.   docusate sodium  100 MG capsule Commonly known as: COLACE Take 1 capsule (100 mg total) by mouth 2 (two) times daily.   enoxaparin  40 MG/0.4ML injection Commonly known as: LOVENOX  Inject 0.4 mLs (40 mg total) into the skin daily for 14 days.   Fe Fum-Vit C-Vit B12-FA Caps capsule Commonly known as: TRIGELS-F FORTE Take 1 capsule by mouth daily after  breakfast.   losartan -hydrochlorothiazide  100-12.5 MG tablet Commonly known as: HYZAAR Take 1 tablet by mouth in the morning.   ondansetron  4 MG tablet Commonly known as: ZOFRAN  Take 1 tablet (4 mg total) by mouth every 6 (six) hours as needed for nausea.   oxyCODONE  5 MG immediate release tablet Commonly known as: Roxicodone  Take 0.5-1 tablets (2.5-5 mg total) by mouth every 8 (eight) hours as needed for breakthrough pain.   tiZANidine 2 MG tablet Commonly known as: ZANAFLEX Take 2 mg by mouth every 8 (eight) hours as needed for muscle spasms.   traMADol  50 MG tablet Commonly known as: ULTRAM  Take 1 tablet (50 mg total) by mouth every 6 (six) hours as needed for moderate pain (pain score 4-6).               Durable Medical Equipment  (From admission, onward)           Start     Ordered   10/28/23 0827  For home use only DME 3 n 1  Once        10/28/23 0826   10/28/23 0826  For home use only DME Walker  Once       Question:  Patient needs a walker to treat with the following condition  Answer:  Status post total hip replacement, left   10/28/23 0826            Diagnostic Studies: DG HIP UNILAT WITH PELVIS 2-3 VIEWS LEFT Result Date: 10/27/2023 CLINICAL DATA:  Elective surgery. EXAM: DG HIP (WITH OR WITHOUT PELVIS) 2-3V LEFT COMPARISON:  None Available. FINDINGS: Four fluoroscopic spot views of the pelvis and left hip obtained in the operating room. Images during hip arthroplasty. Fluoroscopy time 16 seconds. Dose 1.0483 mGy. IMPRESSION: Intraoperative fluoroscopy during left hip arthroplasty. Electronically Signed   By: Andrea Gasman M.D.   On: 10/27/2023 15:38   DG C-Arm 1-60 Min-No Report Result Date: 10/27/2023 Fluoroscopy was utilized by the requesting physician.  No radiographic interpretation.    Disposition: Discharge disposition: 06-Home-Health Care Svc          Follow-up Information     Charlene Debby BROCKS, PA-C Follow up in 2 week(s).    Specialties: Orthopedic Surgery, Emergency Medicine Contact information: [address] [place] [address] [phone]                  Signed: Debby BROCKS Charlene 10/28/2023, 8:29 AM

## 2023-10-28 NOTE — Progress Notes (Signed)
   Subjective: 1 Day Post-Op Procedure(s) (LRB): ARTHROPLASTY, HIP, TOTAL, ANTERIOR APPROACH (Left) Patient reports pain as mild.   Patient is well, and has had no acute complaints or problems Denies any CP, SOB, ABD pain. We will continue therapy today.  Plan is to go Home after hospital stay.  Objective: Vital signs in last 24 hours: Temp:  [97.1 F (36.2 C)-98 F (36.7 C)] 97.9 F (36.6 C) (08/19 0454) Pulse Rate:  [55-86] 78 (08/19 0454) Resp:  [10-22] 16 (08/19 0454) BP: (128-155)/(71-105) 133/91 (08/19 0454) SpO2:  [98 %-100 %] 99 % (08/19 0454) Weight:  [59.4 kg] 59.4 kg (08/18 1019)  Intake/Output from previous day: 08/18 0701 - 08/19 0700 In: 1945.9 [P.O.:240; I.V.:1605.9; IV Piggyback:100] Out: 125 [Blood:125] Intake/Output this shift: No intake/output data recorded.  Recent Labs    10/28/23 0521  HGB 9.9*   Recent Labs    10/28/23 0521  WBC 8.2  RBC 3.15*  HCT 29.9*  PLT 244   Recent Labs    10/28/23 0521  NA 139  K 4.6  CL 101  CO2 27  BUN 18  CREATININE 0.83  GLUCOSE 127*  CALCIUM 9.1   No results for input(s): LABPT, INR in the last 72 hours.  EXAM General - Patient is Alert, Appropriate, and Oriented Extremity - Neurovascular intact Sensation intact distally Intact pulses distally Dorsiflexion/Plantar flexion intact Dressing - dressing C/D/I and no drainage Motor Function - intact, moving foot and toes well on exam.   Past Medical History:  Diagnosis Date   Arthritis    lower back   Hypertension     Assessment/Plan:   1 Day Post-Op Procedure(s) (LRB): ARTHROPLASTY, HIP, TOTAL, ANTERIOR APPROACH (Left) Principal Problem:   S/P total left hip arthroplasty  Estimated body mass index is 25.58 kg/m as calculated from the following:   Height as of this encounter: 5' (1.524 m).   Weight as of this encounter: 59.4 kg. Advance diet Up with therapy, avoid hip abduction Pain well controlled VSS Acute post op blood loss  anemia - Hgb 9.9, start Fe supplement CM to assist with discharge to home with HHPT Today   DVT Prophylaxis - Lovenox , TED hose, and SCDs Weight-Bearing as tolerated to left leg   T. Medford Amber, PA-C [facility] 10/28/2023, 8:31 AM

## 2023-10-28 NOTE — Discharge Instructions (Addendum)
 Instructions after Anterior Total Hip Replacement        Dr. Zachary Aberman, Jr., M.D.      Dept. of Orthopaedics & Sports Medicine  [facility]  [address]  [place], [place]  [address]  Phone: [phone]   Fax: [phone]    DIET: Drink plenty of non-alcoholic fluids. Resume your normal diet. Include foods high in fiber.  ACTIVITY:  You may use crutches or a walker with weight-bearing as tolerated, unless instructed otherwise. You may be weaned off of the walker or crutches by your Physical Therapist.  Continue doing gentle exercises. Exercising will reduce the pain and swelling, increase motion, and prevent muscle weakness.   Please continue to use the TED compression stockings for 2 weeks. You may remove the stockings at night, but should reapply them in the morning. Do not drive or operate any equipment until instructed. AVOID HIP ABDUCTION X 2 WEEKS  WOUND CARE:  Continue to use ice packs periodically to reduce pain and swelling. You may shower with honeycomb dressing 3 days after your surgery. Do not submerge incision site under water. Remove honeycomb dressing 7 days after surgery and allow dermabond to fall off on its own.   MEDICATIONS: You may resume your regular medications. Please take the pain medication as prescribed on the medication list. Do not take pain medication on an empty stomach. You have been given a prescription for a blood thinner to prevent blood clots. Please take the medication as instructed. (NOTE: After completing a 2 week course of Lovenox , take one Enteric-coated 81 mg aspirin twice a day for 3 additional weeks.) Pain medications and iron supplements can cause constipation. Use a stool softener (Senokot or Colace) on a daily basis and a laxative (dulcolax or miralax) as needed. Do not drive or drink alcoholic beverages when taking pain medications.  POSTOPERATIVE CONSTIPATION PROTOCOL Constipation - defined medically as fewer  than three stools per week and severe constipation as less than one stool per week.  One of the most common issues patients have following surgery is constipation.  Even if you have a regular bowel pattern at home, your normal regimen is likely to be disrupted due to multiple reasons following surgery.  Combination of anesthesia, postoperative narcotics, change in appetite and fluid intake all can affect your bowels.  In order to avoid complications following surgery, here are some recommendations in order to help you during your recovery period.  Colace (docusate) - Pick up an over-the-counter form of Colace or another stool softener and take twice a day as long as you are requiring postoperative pain medications.  Take with a full glass of water daily.  If you experience loose stools or diarrhea, hold the colace until you stool forms back up.  If your symptoms do not get better within 1 week or if they get worse, check with your doctor.  Dulcolax (bisacodyl) - Pick up over-the-counter and take as directed by the product packaging as needed to assist with the movement of your bowels.  Take with a full glass of water.  Use this product as needed if not relieved by Colace only.   MiraLax (polyethylene glycol) - Pick up over-the-counter to have on hand.  MiraLax is a solution that will increase the amount of water in your bowels to assist with bowel movements.  Take as directed and can mix with a glass of water, juice, soda, coffee, or tea.  Take if you go more than two days without a movement.  Do not use MiraLax more than once per day. Call your doctor if you are still constipated or irregular after using this medication for 7 days in a row.  If you continue to have problems with postoperative constipation, please contact the office for further assistance and recommendations.  If you experience the worst abdominal pain ever or develop nausea or vomiting, please contact the office immediatly for further  recommendations for treatment.   CALL THE OFFICE FOR: Temperature above 101 degrees Excessive bleeding or drainage on the dressing. Excessive swelling, coldness, or paleness of the toes. Persistent nausea and vomiting.  FOLLOW-UP:  You should have an appointment to return to the office in 2 weeks after surgery. Arrangements have been made for continuation of Physical Therapy (either home therapy or outpatient therapy).

## 2023-10-28 NOTE — Progress Notes (Signed)
 DISCHARGE NOTE:   Pt dc with IV removed and dc instructions given. Pt received a RW and a 3 in 1 to hospital room. Pt voices no questions or concerns at this time. Pt educated on how to give self Lovenox  injection and given a sharps container. Pt wheeled down to medical mall entrance by staff and pt's brother provided transportation.

## 2023-10-28 NOTE — Progress Notes (Signed)
 Physical Therapy Treatment Patient Details Name: Heidi Freeman MRN: [id number] DOB: January 30, 1966 Today's Date: 10/28/2023   History of Present Illness Pt is a 58 yo F diagnosed with left hip osteoarthritis and is s/p L THA.  Per Op Note pt also diagnosed with left greater trochanter partial avulsion injury. PMH includes HTN and hernia repair.    PT Comments  Pt was long sitting in bed upon arrival. Her supportive sister present and will be available at DC. She was able to exit bed without physical assistance, stand to RW, and tolerate ambulation without LOB. Pt also demonstrated safe performance of ascending/descending stairs to enter brothers home(pt Dcing to her brothers). Author highly recommends post acute PT to maximize independence and safety with all ADLs.     If plan is discharge home, recommend the following: A little help with walking and/or transfers;A little help with bathing/dressing/bathroom;Assistance with cooking/housework;Help with stairs or ramp for entrance;Assist for transportation     Equipment Recommendations  Rolling walker (2 wheels);BSC/3in1 (youth RW)       Precautions / Restrictions Precautions Precautions: Anterior Hip Precaution Booklet Issued: Yes (comment) Restrictions Weight Bearing Restrictions Per Provider Order: Yes LLE Weight Bearing Per Provider Order: Weight bearing as tolerated     Mobility  Bed Mobility Overal bed mobility: Modified Independent Bed Mobility: Rolling, Sidelying to Sit, Sit to Supine Rolling: Supervision Sidelying to sit: Supervision Sit to supine: Supervision   Transfers Overall transfer level: Needs assistance Equipment used: Rolling walker (2 wheels) Transfers: Sit to/from Stand Sit to Stand: Supervision   Ambulation/Gait Ambulation/Gait assistance: Supervision Gait Distance (Feet): 200 Feet Assistive device: Rolling walker (2 wheels) Gait Pattern/deviations: Step-through pattern, Antalgic Gait velocity: decreased   General Gait Details: Pt was safely able to ambulate 200 ft with RW. antalgic step through gait    Balance Overall balance assessment: Needs assistance Sitting-balance support: Bilateral upper extremity supported, Feet supported Sitting balance-Leahy Scale: Good     Standing balance support: Bilateral upper extremity supported Standing balance-Leahy Scale: Good     Communication Communication Communication: No apparent difficulties  Cognition Arousal: Alert Behavior During Therapy: WFL for tasks assessed/performed   PT - Cognitive impairments: No apparent impairments      PT - Cognition Comments: Pt is A and O x 4 Following commands: Intact      Cueing Cueing Techniques: Verbal cues, Tactile cues         Pertinent Vitals/Pain Pain Assessment Pain Assessment: 0-10 Pain Score: 3  Pain Location: L hip Pain Descriptors / Indicators: Sore Pain Intervention(s): Limited activity within patient's tolerance, Monitored during session, Premedicated before session, Repositioned, Ice applied     PT Goals (current goals can now be found in the care plan section) Acute Rehab PT Goals Patient Stated Goal: be painfree again Progress towards PT goals: Progressing toward goals    Frequency    BID       AM-PAC PT 6 Clicks Mobility   Outcome Measure  Help needed turning from your back to your side while in a flat bed without using bedrails?: A Little Help needed moving from lying on your back to sitting on the side of a flat bed without using bedrails?: A Little Help needed moving to and from a bed to a chair (including a wheelchair)?: A Little Help needed standing up from a chair using your arms (e.g., wheelchair or bedside chair)?: A Little Help needed to walk in hospital room?: A Little Help needed climbing 3-5 steps with a railing? :  A Little 6 Click Score: 18    End of Session   Activity Tolerance: Patient tolerated treatment well Patient left: in chair;with call  bell/phone within reach;with chair alarm set;with family/visitor present Nurse Communication: Mobility status PT Visit Diagnosis: Other abnormalities of gait and mobility (R26.89);Muscle weakness (generalized) (M62.81);Pain Pain - Right/Left: Left Pain - part of body: Hip     Time: 9173-9155 PT Time Calculation (min) (ACUTE ONLY): 18 min  Charges:    $Gait Training: 8-22 mins PT General Charges $$ ACUTE PT VISIT: 1 Visit                     Rankin Essex PTA 10/28/23, 8:57 AM

## 2023-10-28 NOTE — TOC Transition Note (Signed)
 Transition of Care [facility]) - Discharge Note   Patient Details  Name: Heidi Freeman MRN: [id number] Date of Birth: 1965-06-18  [facility] CM/SW Contact:  Alvaro Louder, LCSW Phone Number: 10/28/2023, 12:04 PM   Clinical Narrative:   ISRAEL discussed PT recommendation of HH Patient was agreeable. LCSWA reached out to [facility] admissions coordinator an started service for patient. The patient reported that he would have her brother come pick him up at discharge. LCSWA discussed PT recommendation  of Youth RW and BSC 3in1. Patient was agreeable, LCSWA reached out to [employer] coordinator to set up equipment delivery.  TOC Signing off   Final next level of care: Home w Home Health Services Barriers to Discharge: No Barriers Identified   Patient Goals and CMS Choice            Discharge Placement              Patient chooses bed at:  (Home) Patient to be transferred to facility by: Brother Name of family member notified: Self Patient and family notified of of transfer: 10/28/23  Discharge Plan and Services Additional resources added to the After Visit Summary for                  DME Arranged: Walker youth, Bedside commode DME Agency: [employer] Date DME Agency Contacted: 10/28/23   Representative spoke with at DME Agency: [place] HH Arranged: PT, OT HH Agency: [facility] Date [facility] Agency Contacted: 10/28/23   Representative spoke with at [facility] Agency: Mitch  Social Drivers of Health (SDOH) Interventions SDOH Screenings   Food Insecurity: No Food Insecurity (10/27/2023)  Housing: Low Risk  (10/27/2023)  Transportation Needs: No Transportation Needs (10/27/2023)  Utilities: Not At Risk (10/27/2023)  Financial Resource Strain: Low Risk  (10/15/2023)   Received from [facility]  Tobacco Use: Medium Risk (10/27/2023)     Readmission Risk Interventions     No data to display

## 2023-10-29 DIAGNOSIS — E119 Type 2 diabetes mellitus without complications: Secondary | ICD-10-CM | POA: Diagnosis not present

## 2023-10-29 DIAGNOSIS — Z96642 Presence of left artificial hip joint: Secondary | ICD-10-CM | POA: Diagnosis not present

## 2023-10-29 DIAGNOSIS — Z7901 Long term (current) use of anticoagulants: Secondary | ICD-10-CM | POA: Diagnosis not present

## 2023-10-29 DIAGNOSIS — I10 Essential (primary) hypertension: Secondary | ICD-10-CM | POA: Diagnosis not present

## 2023-10-29 DIAGNOSIS — Z791 Long term (current) use of non-steroidal anti-inflammatories (NSAID): Secondary | ICD-10-CM | POA: Diagnosis not present

## 2023-11-03 DIAGNOSIS — Z96642 Presence of left artificial hip joint: Secondary | ICD-10-CM | POA: Diagnosis not present

## 2023-11-03 DIAGNOSIS — Z791 Long term (current) use of non-steroidal anti-inflammatories (NSAID): Secondary | ICD-10-CM | POA: Diagnosis not present

## 2023-11-03 DIAGNOSIS — E119 Type 2 diabetes mellitus without complications: Secondary | ICD-10-CM | POA: Diagnosis not present

## 2023-11-03 DIAGNOSIS — I10 Essential (primary) hypertension: Secondary | ICD-10-CM | POA: Diagnosis not present

## 2023-11-03 DIAGNOSIS — Z7901 Long term (current) use of anticoagulants: Secondary | ICD-10-CM | POA: Diagnosis not present

## 2023-11-04 DIAGNOSIS — Z471 Aftercare following joint replacement surgery: Secondary | ICD-10-CM | POA: Diagnosis not present

## 2023-11-04 DIAGNOSIS — Z791 Long term (current) use of non-steroidal anti-inflammatories (NSAID): Secondary | ICD-10-CM | POA: Diagnosis not present

## 2023-11-04 DIAGNOSIS — Z7901 Long term (current) use of anticoagulants: Secondary | ICD-10-CM | POA: Diagnosis not present

## 2023-11-04 DIAGNOSIS — I10 Essential (primary) hypertension: Secondary | ICD-10-CM | POA: Diagnosis not present

## 2023-11-04 DIAGNOSIS — E119 Type 2 diabetes mellitus without complications: Secondary | ICD-10-CM | POA: Diagnosis not present

## 2023-11-05 DIAGNOSIS — I10 Essential (primary) hypertension: Secondary | ICD-10-CM | POA: Diagnosis not present

## 2023-11-05 DIAGNOSIS — E119 Type 2 diabetes mellitus without complications: Secondary | ICD-10-CM | POA: Diagnosis not present

## 2023-11-05 DIAGNOSIS — Z7901 Long term (current) use of anticoagulants: Secondary | ICD-10-CM | POA: Diagnosis not present

## 2023-11-05 DIAGNOSIS — Z471 Aftercare following joint replacement surgery: Secondary | ICD-10-CM | POA: Diagnosis not present

## 2023-11-05 DIAGNOSIS — Z96642 Presence of left artificial hip joint: Secondary | ICD-10-CM | POA: Diagnosis not present

## 2023-11-05 DIAGNOSIS — Z791 Long term (current) use of non-steroidal anti-inflammatories (NSAID): Secondary | ICD-10-CM | POA: Diagnosis not present

## 2023-11-07 DIAGNOSIS — Z471 Aftercare following joint replacement surgery: Secondary | ICD-10-CM | POA: Diagnosis not present

## 2023-11-07 DIAGNOSIS — I10 Essential (primary) hypertension: Secondary | ICD-10-CM | POA: Diagnosis not present

## 2023-11-07 DIAGNOSIS — Z96642 Presence of left artificial hip joint: Secondary | ICD-10-CM | POA: Diagnosis not present

## 2023-11-07 DIAGNOSIS — E119 Type 2 diabetes mellitus without complications: Secondary | ICD-10-CM | POA: Diagnosis not present

## 2023-11-07 DIAGNOSIS — Z791 Long term (current) use of non-steroidal anti-inflammatories (NSAID): Secondary | ICD-10-CM | POA: Diagnosis not present

## 2023-11-07 DIAGNOSIS — Z7901 Long term (current) use of anticoagulants: Secondary | ICD-10-CM | POA: Diagnosis not present

## 2023-11-10 DIAGNOSIS — Z96642 Presence of left artificial hip joint: Secondary | ICD-10-CM | POA: Diagnosis not present

## 2023-11-10 DIAGNOSIS — E119 Type 2 diabetes mellitus without complications: Secondary | ICD-10-CM | POA: Diagnosis not present

## 2023-11-10 DIAGNOSIS — Z791 Long term (current) use of non-steroidal anti-inflammatories (NSAID): Secondary | ICD-10-CM | POA: Diagnosis not present

## 2023-11-10 DIAGNOSIS — Z471 Aftercare following joint replacement surgery: Secondary | ICD-10-CM | POA: Diagnosis not present

## 2023-11-10 DIAGNOSIS — Z7901 Long term (current) use of anticoagulants: Secondary | ICD-10-CM | POA: Diagnosis not present

## 2023-11-10 DIAGNOSIS — I10 Essential (primary) hypertension: Secondary | ICD-10-CM | POA: Diagnosis not present

## 2023-11-12 DIAGNOSIS — Z471 Aftercare following joint replacement surgery: Secondary | ICD-10-CM | POA: Diagnosis not present

## 2023-11-12 DIAGNOSIS — E119 Type 2 diabetes mellitus without complications: Secondary | ICD-10-CM | POA: Diagnosis not present

## 2023-11-12 DIAGNOSIS — Z96642 Presence of left artificial hip joint: Secondary | ICD-10-CM | POA: Diagnosis not present

## 2023-11-12 DIAGNOSIS — Z791 Long term (current) use of non-steroidal anti-inflammatories (NSAID): Secondary | ICD-10-CM | POA: Diagnosis not present

## 2023-11-12 DIAGNOSIS — Z7901 Long term (current) use of anticoagulants: Secondary | ICD-10-CM | POA: Diagnosis not present

## 2023-11-12 DIAGNOSIS — I10 Essential (primary) hypertension: Secondary | ICD-10-CM | POA: Diagnosis not present

## 2023-11-17 DIAGNOSIS — Z7901 Long term (current) use of anticoagulants: Secondary | ICD-10-CM | POA: Diagnosis not present

## 2023-11-17 DIAGNOSIS — Z791 Long term (current) use of non-steroidal anti-inflammatories (NSAID): Secondary | ICD-10-CM | POA: Diagnosis not present

## 2023-11-17 DIAGNOSIS — I10 Essential (primary) hypertension: Secondary | ICD-10-CM | POA: Diagnosis not present

## 2023-11-17 DIAGNOSIS — E119 Type 2 diabetes mellitus without complications: Secondary | ICD-10-CM | POA: Diagnosis not present

## 2023-11-17 DIAGNOSIS — Z96642 Presence of left artificial hip joint: Secondary | ICD-10-CM | POA: Diagnosis not present

## 2023-11-17 DIAGNOSIS — Z471 Aftercare following joint replacement surgery: Secondary | ICD-10-CM | POA: Diagnosis not present

## 2023-11-18 DIAGNOSIS — Z96642 Presence of left artificial hip joint: Secondary | ICD-10-CM | POA: Diagnosis not present

## 2023-11-18 DIAGNOSIS — I10 Essential (primary) hypertension: Secondary | ICD-10-CM | POA: Diagnosis not present

## 2023-11-18 DIAGNOSIS — Z791 Long term (current) use of non-steroidal anti-inflammatories (NSAID): Secondary | ICD-10-CM | POA: Diagnosis not present

## 2023-11-18 DIAGNOSIS — Z7901 Long term (current) use of anticoagulants: Secondary | ICD-10-CM | POA: Diagnosis not present

## 2023-11-18 DIAGNOSIS — E119 Type 2 diabetes mellitus without complications: Secondary | ICD-10-CM | POA: Diagnosis not present

## 2023-11-18 DIAGNOSIS — Z471 Aftercare following joint replacement surgery: Secondary | ICD-10-CM | POA: Diagnosis not present

## 2023-11-19 ENCOUNTER — Other Ambulatory Visit: Payer: Self-pay | Admitting: Internal Medicine

## 2023-11-19 DIAGNOSIS — R7303 Prediabetes: Secondary | ICD-10-CM | POA: Diagnosis not present

## 2023-11-19 DIAGNOSIS — Z0000 Encounter for general adult medical examination without abnormal findings: Secondary | ICD-10-CM | POA: Diagnosis not present

## 2023-11-19 DIAGNOSIS — Z1231 Encounter for screening mammogram for malignant neoplasm of breast: Secondary | ICD-10-CM

## 2023-11-19 DIAGNOSIS — Z1331 Encounter for screening for depression: Secondary | ICD-10-CM | POA: Diagnosis not present

## 2023-11-19 DIAGNOSIS — I10 Essential (primary) hypertension: Secondary | ICD-10-CM | POA: Diagnosis not present

## 2023-11-20 DIAGNOSIS — Z7901 Long term (current) use of anticoagulants: Secondary | ICD-10-CM | POA: Diagnosis not present

## 2023-11-20 DIAGNOSIS — Z471 Aftercare following joint replacement surgery: Secondary | ICD-10-CM | POA: Diagnosis not present

## 2023-11-20 DIAGNOSIS — E119 Type 2 diabetes mellitus without complications: Secondary | ICD-10-CM | POA: Diagnosis not present

## 2023-11-20 DIAGNOSIS — I10 Essential (primary) hypertension: Secondary | ICD-10-CM | POA: Diagnosis not present

## 2023-11-20 DIAGNOSIS — Z791 Long term (current) use of non-steroidal anti-inflammatories (NSAID): Secondary | ICD-10-CM | POA: Diagnosis not present

## 2023-11-20 DIAGNOSIS — Z96642 Presence of left artificial hip joint: Secondary | ICD-10-CM | POA: Diagnosis not present

## 2023-11-27 DIAGNOSIS — Z7901 Long term (current) use of anticoagulants: Secondary | ICD-10-CM | POA: Diagnosis not present

## 2023-11-27 DIAGNOSIS — Z471 Aftercare following joint replacement surgery: Secondary | ICD-10-CM | POA: Diagnosis not present

## 2023-11-27 DIAGNOSIS — I10 Essential (primary) hypertension: Secondary | ICD-10-CM | POA: Diagnosis not present

## 2023-11-27 DIAGNOSIS — E119 Type 2 diabetes mellitus without complications: Secondary | ICD-10-CM | POA: Diagnosis not present

## 2023-11-27 DIAGNOSIS — Z791 Long term (current) use of non-steroidal anti-inflammatories (NSAID): Secondary | ICD-10-CM | POA: Diagnosis not present

## 2023-11-27 DIAGNOSIS — Z96642 Presence of left artificial hip joint: Secondary | ICD-10-CM | POA: Diagnosis not present

## 2023-12-02 DIAGNOSIS — Z96642 Presence of left artificial hip joint: Secondary | ICD-10-CM | POA: Diagnosis not present

## 2023-12-09 DIAGNOSIS — Z96642 Presence of left artificial hip joint: Secondary | ICD-10-CM | POA: Diagnosis not present

## 2023-12-10 DIAGNOSIS — Z7901 Long term (current) use of anticoagulants: Secondary | ICD-10-CM | POA: Diagnosis not present

## 2023-12-10 DIAGNOSIS — E119 Type 2 diabetes mellitus without complications: Secondary | ICD-10-CM | POA: Diagnosis not present

## 2023-12-10 DIAGNOSIS — Z96642 Presence of left artificial hip joint: Secondary | ICD-10-CM | POA: Diagnosis not present

## 2023-12-10 DIAGNOSIS — I10 Essential (primary) hypertension: Secondary | ICD-10-CM | POA: Diagnosis not present

## 2023-12-10 DIAGNOSIS — Z791 Long term (current) use of non-steroidal anti-inflammatories (NSAID): Secondary | ICD-10-CM | POA: Diagnosis not present

## 2023-12-10 DIAGNOSIS — Z471 Aftercare following joint replacement surgery: Secondary | ICD-10-CM | POA: Diagnosis not present

## 2023-12-20 DIAGNOSIS — Z791 Long term (current) use of non-steroidal anti-inflammatories (NSAID): Secondary | ICD-10-CM | POA: Diagnosis not present

## 2023-12-20 DIAGNOSIS — Z96642 Presence of left artificial hip joint: Secondary | ICD-10-CM | POA: Diagnosis not present

## 2023-12-20 DIAGNOSIS — Z7901 Long term (current) use of anticoagulants: Secondary | ICD-10-CM | POA: Diagnosis not present

## 2023-12-20 DIAGNOSIS — I10 Essential (primary) hypertension: Secondary | ICD-10-CM | POA: Diagnosis not present

## 2023-12-20 DIAGNOSIS — E119 Type 2 diabetes mellitus without complications: Secondary | ICD-10-CM | POA: Diagnosis not present

## 2023-12-26 DIAGNOSIS — Z7901 Long term (current) use of anticoagulants: Secondary | ICD-10-CM | POA: Diagnosis not present

## 2023-12-26 DIAGNOSIS — E119 Type 2 diabetes mellitus without complications: Secondary | ICD-10-CM | POA: Diagnosis not present

## 2023-12-26 DIAGNOSIS — Z471 Aftercare following joint replacement surgery: Secondary | ICD-10-CM | POA: Diagnosis not present

## 2023-12-26 DIAGNOSIS — I10 Essential (primary) hypertension: Secondary | ICD-10-CM | POA: Diagnosis not present

## 2023-12-26 DIAGNOSIS — Z96642 Presence of left artificial hip joint: Secondary | ICD-10-CM | POA: Diagnosis not present

## 2023-12-26 DIAGNOSIS — Z791 Long term (current) use of non-steroidal anti-inflammatories (NSAID): Secondary | ICD-10-CM | POA: Diagnosis not present

## 2024-01-12 ENCOUNTER — Ambulatory Visit

## 2024-02-10 ENCOUNTER — Ambulatory Visit

## 2024-03-24 ENCOUNTER — Ambulatory Visit: Payer: Self-pay

## 2024-05-12 ENCOUNTER — Ambulatory Visit
